# Patient Record
Sex: Male | Born: 1975 | Race: Black or African American | Hispanic: No | Marital: Married | Smoking: Never smoker
Health system: Southern US, Community
[De-identification: ages and names within clinical notes are randomized; demographics above are authoritative.]

## PROBLEM LIST (undated history)

## (undated) DIAGNOSIS — E119 Type 2 diabetes mellitus without complications: Secondary | ICD-10-CM

## (undated) DIAGNOSIS — K5900 Constipation, unspecified: Secondary | ICD-10-CM

## (undated) DIAGNOSIS — I639 Cerebral infarction, unspecified: Secondary | ICD-10-CM

## (undated) DIAGNOSIS — I10 Essential (primary) hypertension: Secondary | ICD-10-CM

## (undated) DIAGNOSIS — G629 Polyneuropathy, unspecified: Secondary | ICD-10-CM

## (undated) DIAGNOSIS — K219 Gastro-esophageal reflux disease without esophagitis: Secondary | ICD-10-CM

## (undated) HISTORY — DX: Type 2 diabetes mellitus without complications: E11.9

## (undated) HISTORY — DX: Constipation, unspecified: K59.00

## (undated) HISTORY — PX: APPENDECTOMY: SHX54

## (undated) HISTORY — DX: Polyneuropathy, unspecified: G62.9

## (undated) HISTORY — PX: OTHER SURGICAL HISTORY: SHX169

---

## 2004-09-21 ENCOUNTER — Emergency Department: Payer: Self-pay | Admitting: Emergency Medicine

## 2006-01-19 ENCOUNTER — Emergency Department (HOSPITAL_COMMUNITY): Admission: EM | Admit: 2006-01-19 | Discharge: 2006-01-19 | Payer: Self-pay | Admitting: Emergency Medicine

## 2006-01-20 ENCOUNTER — Emergency Department: Payer: Self-pay | Admitting: Emergency Medicine

## 2006-01-26 ENCOUNTER — Emergency Department: Payer: Self-pay | Admitting: Emergency Medicine

## 2007-05-13 HISTORY — PX: SPINAL FUSION: SHX223

## 2010-11-28 ENCOUNTER — Observation Stay (HOSPITAL_COMMUNITY)
Admission: EM | Admit: 2010-11-28 | Discharge: 2010-11-29 | Disposition: A | Discharge status: Home / Self Care | Payer: Non-veteran care | Source: Ambulatory Visit | Attending: Emergency Medicine | Admitting: Emergency Medicine

## 2010-11-28 ENCOUNTER — Emergency Department (HOSPITAL_COMMUNITY): Payer: Non-veteran care

## 2010-11-28 DIAGNOSIS — N289 Disorder of kidney and ureter, unspecified: Secondary | ICD-10-CM | POA: Insufficient documentation

## 2010-11-28 DIAGNOSIS — R079 Chest pain, unspecified: Principal | ICD-10-CM | POA: Insufficient documentation

## 2010-11-28 DIAGNOSIS — I10 Essential (primary) hypertension: Secondary | ICD-10-CM | POA: Insufficient documentation

## 2010-11-28 DIAGNOSIS — R51 Headache: Secondary | ICD-10-CM | POA: Insufficient documentation

## 2010-11-28 LAB — POCT I-STAT, CHEM 8
BUN: 10 mg/dL (ref 6–23)
Calcium, Ion: 1.17 mmol/L (ref 1.12–1.32)
Chloride: 103 mEq/L (ref 96–112)
Creatinine, Ser: 1.6 mg/dL — ABNORMAL HIGH (ref 0.50–1.35)
Glucose, Bld: 93 mg/dL (ref 70–99)
Potassium: 4.2 mEq/L (ref 3.5–5.1)

## 2010-11-29 DIAGNOSIS — R072 Precordial pain: Secondary | ICD-10-CM

## 2010-11-29 LAB — CK TOTAL AND CKMB (NOT AT ARMC)
CK, MB: 3.1 ng/mL (ref 0.3–4.0)
Relative Index: 0.4 (ref 0.0–2.5)

## 2010-11-29 LAB — TROPONIN I: Troponin I: <0.3 ng/mL (ref <0.30)

## 2010-11-29 LAB — CBC
Platelets: 259 10*3/uL (ref 150–400)
RBC: 5.13 MIL/uL (ref 4.22–5.81)
RDW: 13.4 % (ref 11.5–15.5)

## 2011-05-04 ENCOUNTER — Encounter: Payer: Self-pay | Admitting: Family Medicine

## 2011-05-04 ENCOUNTER — Emergency Department (HOSPITAL_BASED_OUTPATIENT_CLINIC_OR_DEPARTMENT_OTHER)
Admission: EM | Admit: 2011-05-04 | Discharge: 2011-05-04 | Disposition: A | Discharge status: Home / Self Care | Payer: Non-veteran care | Attending: Emergency Medicine | Admitting: Emergency Medicine

## 2011-05-04 ENCOUNTER — Emergency Department (INDEPENDENT_AMBULATORY_CARE_PROVIDER_SITE_OTHER): Payer: Non-veteran care

## 2011-05-04 ENCOUNTER — Other Ambulatory Visit: Payer: Self-pay

## 2011-05-04 DIAGNOSIS — I10 Essential (primary) hypertension: Secondary | ICD-10-CM | POA: Insufficient documentation

## 2011-05-04 DIAGNOSIS — M542 Cervicalgia: Secondary | ICD-10-CM | POA: Insufficient documentation

## 2011-05-04 DIAGNOSIS — W19XXXA Unspecified fall, initial encounter: Secondary | ICD-10-CM

## 2011-05-04 DIAGNOSIS — M25549 Pain in joints of unspecified hand: Secondary | ICD-10-CM

## 2011-05-04 DIAGNOSIS — R079 Chest pain, unspecified: Secondary | ICD-10-CM | POA: Insufficient documentation

## 2011-05-04 DIAGNOSIS — G8929 Other chronic pain: Secondary | ICD-10-CM | POA: Insufficient documentation

## 2011-05-04 DIAGNOSIS — M79609 Pain in unspecified limb: Secondary | ICD-10-CM | POA: Insufficient documentation

## 2011-05-04 DIAGNOSIS — R51 Headache: Secondary | ICD-10-CM | POA: Insufficient documentation

## 2011-05-04 DIAGNOSIS — R0602 Shortness of breath: Secondary | ICD-10-CM

## 2011-05-04 DIAGNOSIS — Z8679 Personal history of other diseases of the circulatory system: Secondary | ICD-10-CM | POA: Insufficient documentation

## 2011-05-04 DIAGNOSIS — M79646 Pain in unspecified finger(s): Secondary | ICD-10-CM

## 2011-05-04 HISTORY — DX: Essential (primary) hypertension: I10

## 2011-05-04 HISTORY — DX: Cerebral infarction, unspecified: I63.9

## 2011-05-04 LAB — BASIC METABOLIC PANEL
BUN: 10 mg/dL (ref 6–23)
CO2: 30 mEq/L (ref 19–32)
GFR calc non Af Amer: 69 mL/min — ABNORMAL LOW (ref >90)
Glucose, Bld: 101 mg/dL — ABNORMAL HIGH (ref 70–99)
Potassium: 3.3 mEq/L — ABNORMAL LOW (ref 3.5–5.1)
Sodium: 142 mEq/L (ref 135–145)

## 2011-05-04 LAB — CBC
Hemoglobin: 13.6 g/dL (ref 13.0–17.0)
MCH: 28 pg (ref 26.0–34.0)
MCV: 80.8 fL (ref 78.0–100.0)
RBC: 4.85 MIL/uL (ref 4.22–5.81)
WBC: 4.7 10*3/uL (ref 4.0–10.5)

## 2011-05-04 LAB — DIFFERENTIAL
Eosinophils Absolute: 0.3 10*3/uL (ref 0.0–0.7)
Eosinophils Relative: 7 % — ABNORMAL HIGH (ref 0–5)
Lymphocytes Relative: 32 % (ref 12–46)
Lymphs Abs: 1.5 10*3/uL (ref 0.7–4.0)
Monocytes Relative: 11 % (ref 3–12)
Neutrophils Relative %: 50 % (ref 43–77)

## 2011-05-04 MED ORDER — POTASSIUM CHLORIDE 20 MEQ/15ML (10%) PO LIQD
20.0000 meq | Freq: Once | ORAL | Status: AC
  Administered 2011-05-04: 20 meq via ORAL
  Filled 2011-05-04: qty 15

## 2011-05-04 MED ORDER — HYDROCODONE-ACETAMINOPHEN 5-500 MG PO TABS: 1.0000 | ORAL_TABLET | Freq: Four times a day (QID) | ORAL | Status: AC | PRN

## 2011-05-04 MED ORDER — DIPHENHYDRAMINE HCL 50 MG/ML IJ SOLN
25.0000 mg | Freq: Once | INTRAMUSCULAR | Status: AC
  Administered 2011-05-04: 25 mg via INTRAVENOUS
  Filled 2011-05-04: qty 1

## 2011-05-04 MED ORDER — MORPHINE SULFATE 4 MG/ML IJ SOLN
4.0000 mg | Freq: Once | INTRAMUSCULAR | Status: AC
  Administered 2011-05-04: 4 mg via INTRAVENOUS
  Filled 2011-05-04: qty 1

## 2011-05-04 MED ORDER — METOCLOPRAMIDE HCL 5 MG/ML IJ SOLN
10.0000 mg | Freq: Once | INTRAMUSCULAR | Status: AC
  Administered 2011-05-04: 10 mg via INTRAVENOUS
  Filled 2011-05-04: qty 2

## 2011-05-04 NOTE — ED Notes (Signed)
MD at bedside. 

## 2011-05-04 NOTE — ED Provider Notes (Signed)
History     CSN: [id number]  Arrival date & time 05/04/11  1035   First MD Initiated Contact with Patient 05/04/11 1146      Chief Complaint  Patient presents with  . Headache  . Pleurisy    (Consider location/radiation/quality/duration/timing/severity/associated sxs/prior treatment) HPI  pw multiple complaints. Pt state he woke up this morning with headaches, cp/sob. C/o 4/10 frontal headache without radiation. Pounding with photophobia, no phonophobia. Also c/o lt sharp cp without radiation. Sob with exertion. States he's had  Similar sx bfore c spine fusion.  Denies new numbness/tingling/weakness of extr.  Denies h/o VTE in self or family. No recent hosp/surg/immob. No h/o cancer. Denies exogenous hormone use, no leg pain or swelling  reports he was moving boxes all day yesterday and is "tired and sore". Requesting xr of right thumb as "it was bent back yestrday and it hurt"   Past Medical History  Diagnosis Date  . Hypertension   . Stroke     at age 69    Past Surgical History  Procedure Date  . Neck fusion     No family history on file.  History  Substance Use Topics  . Smoking status: Never Smoker   . Smokeless tobacco: Not on file  . Alcohol Use: No      Review of Systems  All other systems reviewed and are negative.   except as noted HPI  Allergies  Mobic  Home Medications   Current Outpatient Rx  Name Route Sig Dispense Refill  . HYDROCODONE-ACETAMINOPHEN 5-500 MG PO TABS Oral Take 1 tablet by mouth every 6 (six) hours as needed for pain. 15 tablet 0    BP 119/79  Pulse 57  Temp(Src) 98.2 F (36.8 C) (Oral)  Resp 16  Ht 6\' 1"  (1.854 m)  Wt 210 lb (95.255 kg)  BMI 27.71 kg/m2  SpO2 100%  Physical Exam  Nursing note and vitals reviewed. Constitutional: He is oriented to person, place, and time. He appears well-developed and well-nourished. No distress.  HENT:  Head: Atraumatic.  Mouth/Throat: Oropharynx is clear and moist.  Eyes:  Conjunctivae are normal. Pupils are equal, round, and reactive to light.  Neck: Neck supple.  Cardiovascular: Normal rate, regular rhythm, normal heart sounds and intact distal pulses.  Exam reveals no gallop and no friction rub.   No murmur heard. Pulmonary/Chest: Effort normal. No respiratory distress. He has no wheezes. He has no rales. He exhibits tenderness.       States lt cw ttp reproduces his pain  Abdominal: Soft. Bowel sounds are normal. There is no tenderness. There is no rebound and no guarding.  Musculoskeletal: Normal range of motion. He exhibits no edema and no tenderness.       No midline c/t/l/s ttp  Rt thumb with min ttp mCP. No ecchymosis, deformity, swelling. Cap refill < 2sec, gross sensation intact, full rom with min pain. No snuff box ttp.  Neurological: He is alert and oriented to person, place, and time.       Strength 5/5 all extr  Skin: Skin is warm and dry.  Psychiatric: He has a normal mood and affect.      Date: 05/04/2011  Rate: 68  Rhythm: normal sinus rhythm  QRS Axis: normal  Intervals: normal  ST/T Wave abnormalities: nonspecific ST changes unchanged t wave inversions V4/V5 new previously inverted t wave III now flattened  Conduction Disutrbances:none  Narrative Interpretation:   Old EKG Reviewed: changes noted   ED Course  Procedures (including critical care time)  Labs Reviewed  DIFFERENTIAL - Abnormal; Notable for the following:    Eosinophils Relative 7 (*)    All other components within normal limits  BASIC METABOLIC PANEL - Abnormal; Notable for the following:    Potassium 3.3 (*)    Glucose, Bld 101 (*)    GFR calc non Af Amer 69 (*)    GFR calc Af Amer 80 (*)    All other components within normal limits  CBC  D-DIMER, QUANTITATIVE   No results found.   1. Headache   2. Chest pain   3. Neck pain, chronic   4. Thumb pain     MDM  Multiple complaints- 1. Headache- migrainous components- do not suspect sah or meningitis.   Feeling better aftr reglan/benadryl, ivf  2. Cp/sob- likely msk cp. reproucible on exam. Constant since yesterday. ce neg, dimer neg.cp resolved here.3. Thumb injury- no fx. neurovasc intact. Possible FB on XR but no point of entry on Physical exam.  Will d/c ith pmd fu. Precautions for return.        Forbes Cellar, MD 05/06/11 [phone]

## 2011-05-04 NOTE — ED Notes (Signed)
Patient transported to X-ray 

## 2011-05-04 NOTE — ED Notes (Addendum)
Pt c/o headache 9/10 reports having a CT 2 wks ago for same type of headache. Pt also c/o "intermittent chest tightness" worse with movement and deep inspiration. 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 Pt sts he was lifting heavy objects yesterday and "think it's muscular". Pt sts "I also hurt my thumb yesterday and want to get it checked". Pt followed by [facility] and DR. Rosalie Doctor.

## 2011-06-17 ENCOUNTER — Emergency Department (HOSPITAL_BASED_OUTPATIENT_CLINIC_OR_DEPARTMENT_OTHER)
Admission: EM | Admit: 2011-06-17 | Discharge: 2011-06-17 | Disposition: A | Discharge status: Home / Self Care | Payer: Non-veteran care | Attending: Emergency Medicine | Admitting: Emergency Medicine

## 2011-06-17 ENCOUNTER — Encounter (HOSPITAL_BASED_OUTPATIENT_CLINIC_OR_DEPARTMENT_OTHER): Payer: Self-pay

## 2011-06-17 DIAGNOSIS — R112 Nausea with vomiting, unspecified: Secondary | ICD-10-CM | POA: Insufficient documentation

## 2011-06-17 DIAGNOSIS — K279 Peptic ulcer, site unspecified, unspecified as acute or chronic, without hemorrhage or perforation: Secondary | ICD-10-CM | POA: Insufficient documentation

## 2011-06-17 MED ORDER — OMEPRAZOLE 20 MG PO CPDR: 20.0000 mg | DELAYED_RELEASE_CAPSULE | Freq: Two times a day (BID) | ORAL | Status: DC

## 2011-06-17 MED ORDER — HYDROCODONE-ACETAMINOPHEN 5-325 MG PO TABS: 2.0000 | ORAL_TABLET | ORAL | Status: AC | PRN

## 2011-06-17 NOTE — ED Notes (Signed)
Pt reports headache, nausea and vomiting x 2 PTA.  He also reports "sour stomach". Dizziness and abdominal cramping intermittently since 05/30/11.

## 2011-12-24 ENCOUNTER — Encounter (HOSPITAL_BASED_OUTPATIENT_CLINIC_OR_DEPARTMENT_OTHER): Payer: Self-pay | Admitting: *Deleted

## 2011-12-24 ENCOUNTER — Emergency Department (HOSPITAL_BASED_OUTPATIENT_CLINIC_OR_DEPARTMENT_OTHER)
Admission: EM | Admit: 2011-12-24 | Discharge: 2011-12-25 | Disposition: A | Discharge status: Home / Self Care | Attending: Emergency Medicine | Admitting: Emergency Medicine

## 2011-12-24 ENCOUNTER — Emergency Department (HOSPITAL_BASED_OUTPATIENT_CLINIC_OR_DEPARTMENT_OTHER)

## 2011-12-24 DIAGNOSIS — R51 Headache: Secondary | ICD-10-CM | POA: Insufficient documentation

## 2011-12-24 DIAGNOSIS — R079 Chest pain, unspecified: Secondary | ICD-10-CM

## 2011-12-24 DIAGNOSIS — Z79899 Other long term (current) drug therapy: Secondary | ICD-10-CM | POA: Insufficient documentation

## 2011-12-24 DIAGNOSIS — Z8673 Personal history of transient ischemic attack (TIA), and cerebral infarction without residual deficits: Secondary | ICD-10-CM | POA: Insufficient documentation

## 2011-12-24 DIAGNOSIS — R42 Dizziness and giddiness: Secondary | ICD-10-CM | POA: Insufficient documentation

## 2011-12-24 DIAGNOSIS — I10 Essential (primary) hypertension: Secondary | ICD-10-CM | POA: Insufficient documentation

## 2011-12-24 LAB — COMPREHENSIVE METABOLIC PANEL
ALT: 25 U/L (ref 0–53)
AST: 30 U/L (ref 0–37)
Albumin: 4.3 g/dL (ref 3.5–5.2)
Calcium: 9.1 mg/dL (ref 8.4–10.5)
Creatinine, Ser: 1.4 mg/dL — ABNORMAL HIGH (ref 0.50–1.35)
GFR calc non Af Amer: 63 mL/min — ABNORMAL LOW (ref >90)
Sodium: 142 mEq/L (ref 135–145)
Total Protein: 7.4 g/dL (ref 6.0–8.3)

## 2011-12-24 LAB — URINALYSIS, ROUTINE W REFLEX MICROSCOPIC
Bilirubin Urine: NEGATIVE
Hgb urine dipstick: NEGATIVE
Nitrite: NEGATIVE
Protein, ur: 100 mg/dL — AB
Specific Gravity, Urine: 1.017 (ref 1.005–1.030)
Urobilinogen, UA: 1 mg/dL (ref 0.0–1.0)

## 2011-12-24 LAB — TROPONIN I: Troponin I: <0.3 ng/mL (ref <0.30)

## 2011-12-24 LAB — CBC WITH DIFFERENTIAL/PLATELET
Basophils Absolute: 0 10*3/uL (ref 0.0–0.1)
Basophils Relative: 0 % (ref 0–1)
Eosinophils Absolute: 0.3 10*3/uL (ref 0.0–0.7)
Eosinophils Relative: 6 % — ABNORMAL HIGH (ref 0–5)
HCT: 35.5 % — ABNORMAL LOW (ref 39.0–52.0)
Lymphocytes Relative: 37 % (ref 12–46)
MCHC: 35.5 g/dL (ref 30.0–36.0)
MCV: 81.2 fL (ref 78.0–100.0)
Monocytes Absolute: 0.4 10*3/uL (ref 0.1–1.0)
Platelets: 234 10*3/uL (ref 150–400)
RDW: 13.9 % (ref 11.5–15.5)
WBC: 6 10*3/uL (ref 4.0–10.5)

## 2011-12-24 LAB — URINE MICROSCOPIC-ADD ON

## 2011-12-24 MED ORDER — HYDROMORPHONE HCL 2 MG PO TABS: 2.0000 mg | ORAL_TABLET | ORAL | Status: AC | PRN

## 2011-12-24 NOTE — ED Notes (Signed)
MD at bedside. 

## 2011-12-24 NOTE — ED Provider Notes (Signed)
History     CSN: [id number]  Arrival date & time 12/24/11  2016   First MD Initiated Contact with Patient 12/24/11 2149      Chief Complaint  Patient presents with  . Headache  . Fatigue    (Consider location/radiation/quality/duration/timing/severity/associated sxs/prior treatment) Patient is a 36 y.o. male presenting with headaches. The history is provided by the patient.  Headache  This is a new problem. The current episode started more than 2 days ago (Ongoing for 8 days.). The problem occurs constantly. The problem has not changed since onset.The headache is associated with nothing. Pertinent negatives include no fever, no shortness of breath, no nausea and no vomiting.   headache is bilateral frontal associated with decreased appetite bodyaches intermittent left-sided chest pain symptoms been ongoing for 8 days no nausea vomiting or diarrhea no rash no abdominal pain no back pain no neck pain. Associated with some fatigue.  Past Medical History  Diagnosis Date  . Hypertension   . Stroke     at age 21    Past Surgical History  Procedure Date  . Neck fusion     History reviewed. No pertinent family history.  History  Substance Use Topics  . Smoking status: Never Smoker   . Smokeless tobacco: Not on file  . Alcohol Use: No      Review of Systems  Constitutional: Positive for fatigue. Negative for fever and chills.  HENT: Negative for congestion, sore throat and neck pain.   Respiratory: Negative for cough and shortness of breath.   Cardiovascular: Positive for chest pain. Negative for leg swelling.  Gastrointestinal: Negative for nausea, vomiting, abdominal pain and diarrhea.  Genitourinary: Negative for dysuria and hematuria.  Musculoskeletal: Positive for myalgias. Negative for back pain.  Skin: Negative for rash.  Neurological: Positive for dizziness and headaches. Negative for weakness and numbness.  Hematological: Does not bruise/bleed easily.     Allergies  Meloxicam  Home Medications   Current Outpatient Rx  Name Route Sig Dispense Refill  . CYCLOBENZAPRINE HCL 5 MG PO TABS Oral Take 5 mg by mouth 3 (three) times daily as needed. For spasms.    . IBUPROFEN 800 MG PO TABS Oral Take 800 mg by mouth every 8 (eight) hours as needed. For headache.    . OMEPRAZOLE 20 MG PO CPDR Oral Take 1 capsule (20 mg total) by mouth 2 (two) times daily. 30 capsule 2  . HYDROMORPHONE HCL 2 MG PO TABS Oral Take 1 tablet (2 mg total) by mouth every 4 (four) hours as needed for pain. 20 tablet 0    BP 157/102  Pulse 64  Temp 98.6 F (37 C) (Oral)  Resp 16  Ht 6' (1.829 m)  Wt 195 lb (88.451 kg)  BMI 26.45 kg/m2  SpO2 100%  Physical Exam  Nursing note and vitals reviewed. Constitutional: He is oriented to person, place, and time. He appears well-developed and well-nourished. No distress.  HENT:  Head: Normocephalic and atraumatic.  Mouth/Throat: Oropharynx is clear and moist.  Eyes: Conjunctivae and EOM are normal. Pupils are equal, round, and reactive to light.  Neck: Normal range of motion. Neck supple.  Cardiovascular: Normal rate, regular rhythm, normal heart sounds and intact distal pulses.   No murmur heard. Pulmonary/Chest: Effort normal and breath sounds normal. No respiratory distress. He has no wheezes. He has no rales.  Abdominal: Soft. Bowel sounds are normal. There is no tenderness.  Musculoskeletal: Normal range of motion. He exhibits no edema and no  tenderness.  Lymphadenopathy:    He has no cervical adenopathy.  Neurological: He is alert and oriented to person, place, and time. No cranial nerve deficit. He exhibits normal muscle tone. Coordination normal.  Skin: Skin is warm. No rash noted. No erythema.    ED Course  Procedures (including critical care time)  Labs Reviewed  URINALYSIS, ROUTINE W REFLEX MICROSCOPIC - Abnormal; Notable for the following:    Protein, ur 100 (*)     All other components within  normal limits  CBC WITH DIFFERENTIAL - Abnormal; Notable for the following:    Hemoglobin 12.6 (*)     HCT 35.5 (*)     Eosinophils Relative 6 (*)     All other components within normal limits  COMPREHENSIVE METABOLIC PANEL - Abnormal; Notable for the following:    Potassium 3.1 (*)     Glucose, Bld 109 (*)     Creatinine, Ser 1.40 (*)     GFR calc non Af Amer 63 (*)     GFR calc Af Amer 73 (*)     All other components within normal limits  TROPONIN I  LIPASE, BLOOD  URINE MICROSCOPIC-ADD ON   Dg Chest 2 View  12/24/2011  *RADIOLOGY REPORT*  Clinical Data: Left chest pain, headache.  CHEST - 2 VIEW  Comparison: 05/04/2011  Findings: Cervical fixation hardware is partially seen. Lungs clear.  Heart size and pulmonary vascularity normal.  No effusion. Visualized bones unremarkable.  IMPRESSION: No acute disease  Original Report Authenticated By: Osa Craver, M.D.   Ct Head Wo Contrast  12/24/2011  *RADIOLOGY REPORT*  Clinical Data: Headache, weakness.  CT HEAD WITHOUT CONTRAST  Technique:  Contiguous axial images were obtained from the base of the skull through the vertex without contrast.  Comparison: None.  Findings: Mild atrophy. There is no evidence of acute intracranial hemorrhage, brain edema, mass lesion, acute infarction,   mass effect, or midline shift. Acute infarct may be inapparent on noncontrast CT.  No other intra-axial abnormalities are seen, and the ventricles and sulci are within normal limits in size and symmetry.   No abnormal extra-axial fluid collections or masses are identified.  No significant calvarial abnormality.  IMPRESSION: 1. Negative for bleed or other acute intracranial process.  Original Report Authenticated By: Osa Craver, M.D.   Results for orders placed during the hospital encounter of 12/24/11  URINALYSIS, ROUTINE W REFLEX MICROSCOPIC      Component Value Range   Color, Urine YELLOW  YELLOW   APPearance CLEAR  CLEAR   Specific  Gravity, Urine 1.017  1.005 - 1.030   pH 6.0  5.0 - 8.0   Glucose, UA NEGATIVE  NEGATIVE mg/dL   Hgb urine dipstick NEGATIVE  NEGATIVE   Bilirubin Urine NEGATIVE  NEGATIVE   Ketones, ur NEGATIVE  NEGATIVE mg/dL   Protein, ur 865 (*) NEGATIVE mg/dL   Urobilinogen, UA 1.0  0.0 - 1.0 mg/dL   Nitrite NEGATIVE  NEGATIVE   Leukocytes, UA NEGATIVE  NEGATIVE  TROPONIN I      Component Value Range   Troponin I <0.30  <0.30 ng/mL  CBC WITH DIFFERENTIAL      Component Value Range   WBC 6.0  4.0 - 10.5 K/uL   RBC 4.37  4.22 - 5.81 MIL/uL   Hemoglobin 12.6 (*) 13.0 - 17.0 g/dL   HCT 78.4 (*) 69.6 - 29.5 %   MCV 81.2  78.0 - 100.0 fL   MCH 28.8  26.0 - 34.0 pg   MCHC 35.5  30.0 - 36.0 g/dL   RDW 78.2  95.6 - 21.3 %   Platelets 234  150 - 400 K/uL   Neutrophils Relative 51  43 - 77 %   Neutro Abs 3.0  1.7 - 7.7 K/uL   Lymphocytes Relative 37  12 - 46 %   Lymphs Abs 2.2  0.7 - 4.0 K/uL   Monocytes Relative 7  3 - 12 %   Monocytes Absolute 0.4  0.1 - 1.0 K/uL   Eosinophils Relative 6 (*) 0 - 5 %   Eosinophils Absolute 0.3  0.0 - 0.7 K/uL   Basophils Relative 0  0 - 1 %   Basophils Absolute 0.0  0.0 - 0.1 K/uL  COMPREHENSIVE METABOLIC PANEL      Component Value Range   Sodium 142  135 - 145 mEq/L   Potassium 3.1 (*) 3.5 - 5.1 mEq/L   Chloride 104  96 - 112 mEq/L   CO2 29  19 - 32 mEq/L   Glucose, Bld 109 (*) 70 - 99 mg/dL   BUN 9  6 - 23 mg/dL   Creatinine, Ser 0.86 (*) 0.50 - 1.35 mg/dL   Calcium 9.1  8.4 - 57.8 mg/dL   Total Protein 7.4  6.0 - 8.3 g/dL   Albumin 4.3  3.5 - 5.2 g/dL   AST 30  0 - 37 U/L   ALT 25  0 - 53 U/L   Alkaline Phosphatase 89  39 - 117 U/L   Total Bilirubin 0.8  0.3 - 1.2 mg/dL   GFR calc non Af Amer 63 (*) >90 mL/min   GFR calc Af Amer 73 (*) >90 mL/min  LIPASE, BLOOD      Component Value Range   Lipase 34  11 - 59 U/L  URINE MICROSCOPIC-ADD ON      Component Value Range   Squamous Epithelial / LPF RARE  RARE   Bacteria, UA RARE  RARE    Date:  12/24/2011  Rate: 60  Rhythm: normal sinus rhythm  QRS Axis: normal  Intervals: normal  ST/T Wave abnormalities: nonspecific T wave changes  Conduction Disutrbances:none  Narrative Interpretation:   Old EKG Reviewed: unchanged  EKG is unchanged from 05/04/2011.   1. Headache   2. Chest pain       MDM  Patient symptoms have been ongoing for 8 days include headaches and dizziness fatigue some bodyaches a little bit intermittent chest pain today workup here today is negative for the most part his CT negative cardiac markers negative EKG without any acute changes may just be a viral process. Patient given reassurance told to followup with the aorta at a local doctor to resource guide his symptoms persist he can return for new worse symptoms. Work note provided to give it some rest for couple days patient recommended to take Naprosyn or Motrin he is able to take this despite his allergy also given some Dilaudid as needed for more severe pain. Certainly in the emergency department no acute distress nontoxic.       Shelda Jakes, MD 12/25/11 0000

## 2011-12-24 NOTE — ED Notes (Signed)
Pt c/o headache, dizziness, and fatigue since last week. Denies any cold or cough, no bites or fevers.

## 2013-04-13 ENCOUNTER — Encounter (HOSPITAL_COMMUNITY): Payer: Self-pay | Admitting: Emergency Medicine

## 2013-04-13 ENCOUNTER — Emergency Department (HOSPITAL_COMMUNITY)
Admission: EM | Admit: 2013-04-13 | Discharge: 2013-04-13 | Discharge status: Against Medical Advice | Attending: Emergency Medicine | Admitting: Emergency Medicine

## 2013-04-13 DIAGNOSIS — R0602 Shortness of breath: Secondary | ICD-10-CM | POA: Insufficient documentation

## 2013-04-13 DIAGNOSIS — R630 Anorexia: Secondary | ICD-10-CM | POA: Insufficient documentation

## 2013-04-13 DIAGNOSIS — Z8673 Personal history of transient ischemic attack (TIA), and cerebral infarction without residual deficits: Secondary | ICD-10-CM | POA: Insufficient documentation

## 2013-04-13 DIAGNOSIS — I10 Essential (primary) hypertension: Secondary | ICD-10-CM | POA: Insufficient documentation

## 2013-04-13 DIAGNOSIS — R079 Chest pain, unspecified: Secondary | ICD-10-CM | POA: Insufficient documentation

## 2013-04-13 LAB — CBC
HCT: 37.9 % — ABNORMAL LOW (ref 39.0–52.0)
Hemoglobin: 13.3 g/dL (ref 13.0–17.0)
MCHC: 35.1 g/dL (ref 30.0–36.0)
RBC: 4.68 MIL/uL (ref 4.22–5.81)
WBC: 5.8 10*3/uL (ref 4.0–10.5)

## 2013-04-13 LAB — BASIC METABOLIC PANEL
Chloride: 102 mEq/L (ref 96–112)
GFR calc non Af Amer: 66 mL/min — ABNORMAL LOW (ref >90)
Glucose, Bld: 99 mg/dL (ref 70–99)
Potassium: 3.9 mEq/L (ref 3.5–5.1)
Sodium: 140 mEq/L (ref 135–145)

## 2013-04-13 LAB — POCT I-STAT TROPONIN I

## 2013-04-13 NOTE — ED Notes (Signed)
Pt in c/o chest pain, states pain started last night and was sharp then eased to aching pain and has been intermittent all day, decreased appetite and mild shortness of breath today also

## 2013-04-13 NOTE — ED Notes (Signed)
No answer in waiting room 

## 2013-04-13 NOTE — ED Notes (Signed)
No answer in waiting area.

## 2013-05-10 ENCOUNTER — Encounter (HOSPITAL_COMMUNITY): Payer: Self-pay | Admitting: Emergency Medicine

## 2013-05-10 DIAGNOSIS — Z8673 Personal history of transient ischemic attack (TIA), and cerebral infarction without residual deficits: Secondary | ICD-10-CM | POA: Insufficient documentation

## 2013-05-10 DIAGNOSIS — I10 Essential (primary) hypertension: Secondary | ICD-10-CM | POA: Insufficient documentation

## 2013-05-10 DIAGNOSIS — R1110 Vomiting, unspecified: Secondary | ICD-10-CM | POA: Insufficient documentation

## 2013-05-10 DIAGNOSIS — Z79899 Other long term (current) drug therapy: Secondary | ICD-10-CM | POA: Insufficient documentation

## 2013-05-10 DIAGNOSIS — J209 Acute bronchitis, unspecified: Secondary | ICD-10-CM | POA: Insufficient documentation

## 2013-05-10 NOTE — ED Notes (Signed)
Pt. reports nausea/vomitting onset today , denies diarrhea, no fever or chills.

## 2013-05-11 ENCOUNTER — Emergency Department (HOSPITAL_COMMUNITY)
Admission: EM | Admit: 2013-05-11 | Discharge: 2013-05-11 | Disposition: A | Discharge status: Home / Self Care | Attending: Emergency Medicine | Admitting: Emergency Medicine

## 2013-05-11 ENCOUNTER — Encounter (HOSPITAL_COMMUNITY): Payer: Self-pay | Admitting: Emergency Medicine

## 2013-05-11 ENCOUNTER — Emergency Department (HOSPITAL_COMMUNITY)

## 2013-05-11 DIAGNOSIS — J209 Acute bronchitis, unspecified: Secondary | ICD-10-CM

## 2013-05-11 DIAGNOSIS — R1110 Vomiting, unspecified: Secondary | ICD-10-CM

## 2013-05-11 LAB — BASIC METABOLIC PANEL
CO2: 27 mEq/L (ref 19–32)
Chloride: 100 mEq/L (ref 96–112)
Glucose, Bld: 102 mg/dL — ABNORMAL HIGH (ref 70–99)
Sodium: 140 mEq/L (ref 137–147)

## 2013-05-11 LAB — CBC WITH DIFFERENTIAL/PLATELET
Basophils Absolute: 0 10*3/uL (ref 0.0–0.1)
HCT: 36.4 % — ABNORMAL LOW (ref 39.0–52.0)
Lymphocytes Relative: 17 % (ref 12–46)
Lymphs Abs: 1.3 10*3/uL (ref 0.7–4.0)
Monocytes Absolute: 0.8 10*3/uL (ref 0.1–1.0)
Neutro Abs: 5.1 10*3/uL (ref 1.7–7.7)
Platelets: 214 10*3/uL (ref 150–400)
RBC: 4.47 MIL/uL (ref 4.22–5.81)
RDW: 13.3 % (ref 11.5–15.5)
WBC: 7.6 10*3/uL (ref 4.0–10.5)

## 2013-05-11 MED ORDER — IPRATROPIUM BROMIDE 0.02 % IN SOLN
0.5000 mg | Freq: Once | RESPIRATORY_TRACT | Status: AC
  Administered 2013-05-11: 0.5 mg via RESPIRATORY_TRACT
  Filled 2013-05-11: qty 2.5

## 2013-05-11 MED ORDER — ALBUTEROL SULFATE HFA 108 (90 BASE) MCG/ACT IN AERS: 2.0000 | INHALATION_SPRAY | RESPIRATORY_TRACT | Status: AC | PRN

## 2013-05-11 MED ORDER — ACETAMINOPHEN 325 MG PO TABS
650.0000 mg | ORAL_TABLET | Freq: Once | ORAL | Status: AC
  Administered 2013-05-11: 650 mg via ORAL
  Filled 2013-05-11: qty 2

## 2013-05-11 MED ORDER — ALBUTEROL SULFATE (2.5 MG/3ML) 0.083% IN NEBU
2.5000 mg | INHALATION_SOLUTION | Freq: Once | RESPIRATORY_TRACT | Status: AC
  Administered 2013-05-11: 2.5 mg via RESPIRATORY_TRACT
  Filled 2013-05-11: qty 3

## 2013-05-11 NOTE — ED Provider Notes (Signed)
CSN: [id number]     Arrival date & time 05/10/13  2335 History   First MD Initiated Contact with Patient 05/11/13 0242     Chief Complaint  Patient presents with  . Emesis   (Consider location/radiation/quality/duration/timing/severity/associated sxs/prior Treatment) Patient is a 37 y.o. male presenting with vomiting. The history is provided by the patient.  Emesis He started having nasal congestion and runny nose 3 days ago. Today, he started having a cough productive of some yellowish sputum. There is post tussive emesis. He is also having some yellowish to greenish rhinorrhea. He denies fever, chills, sweats. Denies arthralgias or myalgias. He denies diarrhea and he denies nausea. He states the only vomiting that has occurred has been posttussive. He did have a sick contact 2 days before he started getting sick. He is a nonsmoker. He did not get the influenza immunization this year.  Past Medical History  Diagnosis Date  . Hypertension   . Stroke     at age 78   Past Surgical History  Procedure Laterality Date  . Neck fusion     History reviewed. No pertinent family history. History  Substance Use Topics  . Smoking status: Never Smoker   . Smokeless tobacco: Not on file  . Alcohol Use: No    Review of Systems  Gastrointestinal: Positive for vomiting.  All other systems reviewed and are negative.    Allergies  Mobic  Home Medications   Current Outpatient Rx  Name  Route  Sig  Dispense  Refill  . cyclobenzaprine (FLEXERIL) 5 MG tablet   Oral   Take 5 mg by mouth 3 (three) times daily as needed. For spasms.         Marland Kitchen ibuprofen (ADVIL,MOTRIN) 800 MG tablet   Oral   Take 800 mg by mouth every 8 (eight) hours as needed. For headache.         . lisinopril (PRINIVIL,ZESTRIL) 5 MG tablet   Oral   Take 2.5 mg by mouth daily.         . traMADol (ULTRAM) 50 MG tablet   Oral   Take 50 mg by mouth every 8 (eight) hours as needed (neck pain).          BP  132/88  Pulse 62  Temp(Src) 98.2 F (36.8 C) (Oral)  Resp 18  SpO2 93% Physical Exam  Nursing note and vitals reviewed.  37 year old male, resting comfortably and in no acute distress. Vital signs are significant for mild hypertension with blood pressure 150/95. Oxygen saturation is 100%, which is normal. Head is normocephalic and atraumatic. PERRLA, EOMI. Oropharynx is clear. There is no sinus tenderness. Neck is nontender and supple without adenopathy or JVD. Back is nontender and there is no CVA tenderness. Lungs have a slightly prolonged exhalation phase without rales, wheezes, or rhonchi. Mild wheezing is noted with coughing. Chest is nontender. Heart has regular rate and rhythm without murmur. Abdomen is soft, flat, nontender without masses or hepatosplenomegaly and peristalsis is normoactive. Extremities have no cyanosis or edema, full range of motion is present. Skin is warm and dry without rash. Neurologic: Mental status is normal, cranial nerves are intact, there are no motor or sensory deficits.  ED Course  Procedures (including critical care time) Labs Review Results for orders placed during the hospital encounter of 05/11/13  CBC WITH DIFFERENTIAL      Result Value Range   WBC 7.6  4.0 - 10.5 K/uL   RBC 4.47  4.22 -  5.81 MIL/uL   Hemoglobin 12.9 (*) 13.0 - 17.0 g/dL   HCT 81.1 (*) 91.4 - 78.2 %   MCV 81.4  78.0 - 100.0 fL   MCH 28.9  26.0 - 34.0 pg   MCHC 35.4  30.0 - 36.0 g/dL   RDW 95.6  21.3 - 08.6 %   Platelets 214  150 - 400 K/uL   Neutrophils Relative % 68  43 - 77 %   Neutro Abs 5.1  1.7 - 7.7 K/uL   Lymphocytes Relative 17  12 - 46 %   Lymphs Abs 1.3  0.7 - 4.0 K/uL   Monocytes Relative 11  3 - 12 %   Monocytes Absolute 0.8  0.1 - 1.0 K/uL   Eosinophils Relative 4  0 - 5 %   Eosinophils Absolute 0.3  0.0 - 0.7 K/uL   Basophils Relative 1  0 - 1 %   Basophils Absolute 0.0  0.0 - 0.1 K/uL  BASIC METABOLIC PANEL      Result Value Range   Sodium 140   137 - 147 mEq/L   Potassium 3.5 (*) 3.7 - 5.3 mEq/L   Chloride 100  96 - 112 mEq/L   CO2 27  19 - 32 mEq/L   Glucose, Bld 102 (*) 70 - 99 mg/dL   BUN 11  6 - 23 mg/dL   Creatinine, Ser 5.78  0.50 - 1.35 mg/dL   Calcium 8.2 (*) 8.4 - 10.5 mg/dL   GFR calc non Af Amer 76 (*) >90 mL/min   GFR calc Af Amer 88 (*) >90 mL/min    Imaging Review Dg Chest 2 View  05/11/2013   CLINICAL DATA:  Cough with vomiting  EXAM: CHEST  2 VIEW  COMPARISON:  Prior radiograph from 12/24/2011  FINDINGS: The cardiac and mediastinal silhouettes are stable in size and contour, and remain within normal limits.  The lungs are normally inflated. No airspace consolidation, pleural effusion, or pulmonary edema is identified. There is no pneumothorax.  No acute osseous abnormality identified. ACDF overlies the lower cervical spine.  IMPRESSION: No active cardiopulmonary disease.   Electronically Signed   By: Rise Mu M.D.   On: 05/11/2013 03:26    MDM   1. Acute bronchitis   2. Post-tussive emesis    Respiratory tract infection with rhinorrhea and cough and posttussive emesis. Chest x-ray or be obtained to rule out pneumonia, and he will be given a therapeutic trial of albuterol with ipratropium.  He feels much better after above noted treatment. Chest x-ray shows no evidence of pneumonia. He is discharged with prescription for albuterol inhaler.  Dione Booze, MD 05/11/13 [phone]

## 2013-09-05 ENCOUNTER — Encounter (HOSPITAL_COMMUNITY): Payer: Self-pay | Admitting: Emergency Medicine

## 2013-09-05 ENCOUNTER — Emergency Department (HOSPITAL_COMMUNITY)
Admission: EM | Admit: 2013-09-05 | Discharge: 2013-09-05 | Discharge status: Against Medical Advice | Attending: Emergency Medicine | Admitting: Emergency Medicine

## 2013-09-05 DIAGNOSIS — Z8673 Personal history of transient ischemic attack (TIA), and cerebral infarction without residual deficits: Secondary | ICD-10-CM | POA: Insufficient documentation

## 2013-09-05 DIAGNOSIS — I10 Essential (primary) hypertension: Secondary | ICD-10-CM | POA: Insufficient documentation

## 2013-09-05 DIAGNOSIS — R109 Unspecified abdominal pain: Secondary | ICD-10-CM | POA: Insufficient documentation

## 2013-09-05 LAB — CBC WITH DIFFERENTIAL/PLATELET
BASOS ABS: 0 10*3/uL (ref 0.0–0.1)
BASOS PCT: 0 % (ref 0–1)
EOS ABS: 0.3 10*3/uL (ref 0.0–0.7)
Eosinophils Relative: 5 % (ref 0–5)
HCT: 40.2 % (ref 39.0–52.0)
HEMOGLOBIN: 14 g/dL (ref 13.0–17.0)
Lymphocytes Relative: 33 % (ref 12–46)
Lymphs Abs: 2 10*3/uL (ref 0.7–4.0)
MCH: 28.1 pg (ref 26.0–34.0)
MCHC: 34.8 g/dL (ref 30.0–36.0)
MCV: 80.7 fL (ref 78.0–100.0)
Monocytes Absolute: 0.4 10*3/uL (ref 0.1–1.0)
Monocytes Relative: 7 % (ref 3–12)
NEUTROS ABS: 3.3 10*3/uL (ref 1.7–7.7)
NEUTROS PCT: 55 % (ref 43–77)
PLATELETS: 232 10*3/uL (ref 150–400)
RBC: 4.98 MIL/uL (ref 4.22–5.81)
RDW: 13.5 % (ref 11.5–15.5)
WBC: 5.9 10*3/uL (ref 4.0–10.5)

## 2013-09-05 LAB — COMPREHENSIVE METABOLIC PANEL
ALBUMIN: 4.3 g/dL (ref 3.5–5.2)
ALK PHOS: 97 U/L (ref 39–117)
ALT: 31 U/L (ref 0–53)
AST: 32 U/L (ref 0–37)
BILIRUBIN TOTAL: 0.7 mg/dL (ref 0.3–1.2)
BUN: 10 mg/dL (ref 6–23)
CHLORIDE: 105 meq/L (ref 96–112)
CO2: 27 mEq/L (ref 19–32)
Calcium: 9.3 mg/dL (ref 8.4–10.5)
Creatinine, Ser: 1.37 mg/dL — ABNORMAL HIGH (ref 0.50–1.35)
GFR calc Af Amer: 74 mL/min — ABNORMAL LOW (ref >90)
GFR calc non Af Amer: 64 mL/min — ABNORMAL LOW (ref >90)
Glucose, Bld: 90 mg/dL (ref 70–99)
POTASSIUM: 3.6 meq/L — AB (ref 3.7–5.3)
Sodium: 146 mEq/L (ref 137–147)
TOTAL PROTEIN: 7.9 g/dL (ref 6.0–8.3)

## 2013-09-05 NOTE — ED Notes (Signed)
Pt reports LLQ and left groin pain since today. Reports this happened before but it went away. Reports left testicle is tender but is not swollen. States pain feels likes an ache. Denies n/v but reports he is feeling weak. Pt is a x 4. VSS

## 2013-09-05 NOTE — ED Notes (Signed)
Attempted to call x 3 with no response.

## 2020-05-29 ENCOUNTER — Other Ambulatory Visit: Payer: Self-pay

## 2020-05-29 ENCOUNTER — Emergency Department (HOSPITAL_COMMUNITY)
Admission: EM | Admit: 2020-05-29 | Discharge: 2020-05-29 | Disposition: A | Discharge status: Home / Self Care | Payer: No Typology Code available for payment source | Attending: Emergency Medicine | Admitting: Emergency Medicine

## 2020-05-29 ENCOUNTER — Emergency Department (HOSPITAL_COMMUNITY): Payer: No Typology Code available for payment source

## 2020-05-29 ENCOUNTER — Encounter (HOSPITAL_COMMUNITY): Payer: Self-pay | Admitting: Emergency Medicine

## 2020-05-29 DIAGNOSIS — Z79899 Other long term (current) drug therapy: Secondary | ICD-10-CM | POA: Diagnosis not present

## 2020-05-29 DIAGNOSIS — W009XXA Unspecified fall due to ice and snow, initial encounter: Secondary | ICD-10-CM | POA: Insufficient documentation

## 2020-05-29 DIAGNOSIS — S99912A Unspecified injury of left ankle, initial encounter: Secondary | ICD-10-CM | POA: Diagnosis present

## 2020-05-29 DIAGNOSIS — S82842A Displaced bimalleolar fracture of left lower leg, initial encounter for closed fracture: Secondary | ICD-10-CM | POA: Diagnosis not present

## 2020-05-29 DIAGNOSIS — S9305XA Dislocation of left ankle joint, initial encounter: Secondary | ICD-10-CM | POA: Insufficient documentation

## 2020-05-29 DIAGNOSIS — I10 Essential (primary) hypertension: Secondary | ICD-10-CM | POA: Diagnosis not present

## 2020-05-29 DIAGNOSIS — W19XXXA Unspecified fall, initial encounter: Secondary | ICD-10-CM

## 2020-05-29 DIAGNOSIS — S82892A Other fracture of left lower leg, initial encounter for closed fracture: Secondary | ICD-10-CM

## 2020-05-29 MED ORDER — ONDANSETRON HCL 4 MG/2ML IJ SOLN
4.0000 mg | Freq: Once | INTRAMUSCULAR | Status: AC
  Administered 2020-05-29: 4 mg via INTRAVENOUS
  Filled 2020-05-29: qty 2

## 2020-05-29 MED ORDER — HYDROMORPHONE HCL 1 MG/ML IJ SOLN
1.0000 mg | Freq: Once | INTRAMUSCULAR | Status: AC
  Administered 2020-05-29: 1 mg via INTRAVENOUS
  Filled 2020-05-29: qty 1

## 2020-05-29 MED ORDER — OXYCODONE-ACETAMINOPHEN 5-325 MG PO TABS: 1.0000 | ORAL_TABLET | ORAL | 0 refills | Status: DC | PRN

## 2020-05-29 MED ORDER — PROPOFOL 10 MG/ML IV BOLUS
0.5000 mg/kg | Freq: Once | INTRAVENOUS | Status: DC
  Filled 2020-05-29: qty 20

## 2020-05-29 MED ORDER — PROPOFOL 10 MG/ML IV BOLUS
INTRAVENOUS | Status: AC | PRN
  Administered 2020-05-29: 44.25 mg via INTRAVENOUS

## 2020-05-29 NOTE — Consult Note (Signed)
Reason for Consult: Left ankle pain Referring Physician: Dr. Meridee Score  Garrett Thompson. is an 45 y.o. male.  HPI: Patient slipped on ice today.  Sustained left ankle injury.  Denies any other orthopedic complaints.  No personal or family history of DVT or pulmonary embolism.  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  He is active.  Past Medical History:  Diagnosis Date  . Hypertension   . Stroke [facility])    at age 12    Past Surgical History:  Procedure Laterality Date  . neck fusion      History reviewed. No pertinent family history.  Social History:  reports that he has never smoked. He has never used smokeless tobacco. He reports that he does not drink alcohol and does not use drugs.  Allergies:  Allergies  Allergen Reactions  . Mobic [Meloxicam]     Nose Bleed    Medications: I have reviewed the patient's current medications.  No results found for this or any previous visit (from the past 48 hour(s)).  DG Ankle Complete Left  Result Date: 05/29/2020 CLINICAL DATA:  Fall EXAM: LEFT ANKLE COMPLETE - 3+ VIEW COMPARISON:  None. FINDINGS: Acute fracture involving distal shaft of the fibula with greater than 1 shaft diameter lateral and posterior displacement of distal fracture fragment in addition to moderate angulation. Acute mildly comminuted fracture involving the medial malleolus of the distal tibia. Lateral and posterior dislocation of the talus with respect to the distal tibia. IMPRESSION: Acute displaced and angulated fracture involving distal fibula with acute mildly comminuted medial malleolar fracture. There is lateral and posterior dislocation of the talus with respect to the distal tibia. Electronically Signed   By: Jasmine Pang M.D.   On: 05/29/2020 20:31    Review of Systems  Musculoskeletal: Positive for arthralgias.  All other systems reviewed and are negative.  Blood pressure (!) 192/123, pulse 97, temperature 98.7 F (37.1 C),  temperature source Oral, resp. rate 16, height 6' (1.829 m), weight 88.5 kg, SpO2 99 %. Physical Exam Vitals reviewed.  HENT:     Head: Normocephalic.     Nose: Nose normal.     Mouth/Throat:     Mouth: Mucous membranes are moist.  Eyes:     Pupils: Pupils are equal, round, and reactive to light.  Cardiovascular:     Rate and Rhythm: Normal rate.     Pulses: Normal pulses.  Pulmonary:     Effort: Pulmonary effort is normal.  Abdominal:     General: Abdomen is flat.  Musculoskeletal:     Cervical back: Normal range of motion.  Skin:    General: Skin is warm.     Capillary Refill: Capillary refill takes less than 2 seconds.  Neurological:     General: No focal deficit present.     Mental Status: He is alert.  Psychiatric:        Mood and Affect: Mood normal.    Weight examination of the left ankle demonstrates palpable pedal pulses soft compartments intact ankle and toe dorsiflexion plantarflexion but it is painful.  Both knees have no effusion.  No upper extremity complaints with reasonable range of motion wrist elbows shoulders. Assessment/Plan: Impression is left ankle fracture dislocation.  Patient has lateral subluxation of the talus relative to the tibia.  Under conscious sedation the ankle is reduced.  Post reduction radiographs look good and show improved alignment.  Plan is for delayed open reduction internal fixation within 7 days.  Patient will need to keep the leg elevated.  All questions answered.  Plan for nonweightbearing with return to clinic on Friday to schedule surgical intervention.  We will need to examine the ankle at that time for fracture blisters.  Marrianne Mood Dean 05/29/2020, 10:12 PM

## 2020-05-29 NOTE — Progress Notes (Signed)
Orthopedic Tech Progress Note Patient Details:  Garrett Thompson. 07/05/1975 [id number]  Ortho Devices Type of Ortho Device: Ace wrap,Stirrup splint,Short leg splint Ortho Device/Splint Interventions: Application   Post Interventions Patient Tolerated: Well Instructions Provided: Care of device   Saul Fordyce 05/29/2020, 10:03 PM

## 2020-05-29 NOTE — ED Provider Notes (Signed)
.  Sedation  Date/Time: 05/29/2020 9:59 PM Performed by: Terrilee Files, MD Authorized by: Terrilee Files, MD   Consent:    Consent obtained:  Verbal and written   Consent given by:  Patient   Risks discussed:  Allergic reaction, dysrhythmia, inadequate sedation, nausea, prolonged hypoxia resulting in organ damage, prolonged sedation necessitating reversal, respiratory compromise necessitating ventilatory assistance and intubation and vomiting   Alternatives discussed:  Analgesia without sedation, anxiolysis and regional anesthesia Universal protocol:    Procedure explained and questions answered to patient or proxy's satisfaction: yes     Relevant documents present and verified: yes     Test results available: yes     Imaging studies available: yes     Required blood products, implants, devices, and special equipment available: yes     Site/side marked: yes     Immediately prior to procedure, a time out was called: yes     Patient identity confirmed:  Verbally with patient and arm band Indications:    Procedure performed:  Fracture reduction   Procedure necessitating sedation performed by:  Different physician Pre-sedation assessment:    Time since last food or drink:  8   ASA classification: class 1 - normal, healthy patient     Mouth opening:  3 or more finger widths   Thyromental distance:  4 finger widths   Mallampati score:  I - soft palate, uvula, fauces, pillars visible   Neck mobility: normal     Pre-sedation assessments completed and reviewed: airway patency, cardiovascular function, hydration status, mental status, nausea/vomiting, pain level, respiratory function and temperature     Pre-sedation assessment completed:  05/29/2020 9:20 PM Immediate pre-procedure details:    Reassessment: Patient reassessed immediately prior to procedure     Reviewed: vital signs, relevant labs/tests and NPO status     Verified: bag valve mask available, emergency equipment available,  intubation equipment available, IV patency confirmed, oxygen available and suction available   Procedure details (see MAR for exact dosages):    Preoxygenation:  Nasal cannula   Sedation:  Propofol   Intended level of sedation: deep   Intra-procedure monitoring:  Blood pressure monitoring, cardiac monitor, continuous pulse oximetry, frequent LOC assessments, frequent vital sign checks and continuous capnometry   Intra-procedure events: none     Total Provider sedation time (minutes):  15 Post-procedure details:    Post-sedation assessment completed:  05/29/2020 10:30 PM   Attendance: Constant attendance by certified staff until patient recovered     Recovery: Patient returned to pre-procedure baseline     Post-sedation assessments completed and reviewed: airway patency, cardiovascular function, hydration status, mental status, nausea/vomiting, pain level, respiratory function and temperature     Patient is stable for discharge or admission: yes     Procedure completion:  Tolerated well, no immediate complications      Terrilee Files, MD 05/30/20 1058

## 2020-05-29 NOTE — ED Triage Notes (Signed)
Pt slipped on ice. Pt has deformity to left ankle. EMS splinted left ankle. Pt denies neck and back pain, denies LOC and denies hitting his head. Pt has hx of hypertension.

## 2020-05-29 NOTE — ED Provider Notes (Addendum)
[facility]-EMERGENCY DEPT Provider Note   CSN: [id number] Arrival date & time: 05/29/20  1749     History Chief Complaint  Patient presents with  . Ankle Pain    Left ankle deformity    Garrett Thompson. is a 45 y.o. male.  Pt reports he slipped on the ice and injured his ankle   The history is provided by the patient. No language interpreter was used.  Ankle Pain Location:  Ankle Injury: no   Ankle location:  L ankle Pain details:    Quality:  Aching   Severity:  Severe   Onset quality:  Sudden   Timing:  Constant Chronicity:  New Prior injury to area:  No Relieved by:  Nothing Worsened by:  Nothing Ineffective treatments:  None tried Associated symptoms: swelling   Risk factors: no concern for non-accidental trauma   Pt complains of pain and swelling    Past Medical History:  Diagnosis Date  . Hypertension   . Stroke [facility])    at age 3    There are no problems to display for this patient.   Past Surgical History:  Procedure Laterality Date  . neck fusion         History reviewed. No pertinent family history.  Social History   Tobacco Use  . Smoking status: Never Smoker  . Smokeless tobacco: Never Used  Substance Use Topics  . Alcohol use: No  . Drug use: No    Home Medications Prior to Admission medications   Medication Sig Start Date End Date Taking? Authorizing Provider  albuterol (PROVENTIL HFA;VENTOLIN HFA) 108 (90 BASE) MCG/ACT inhaler Inhale 2 puffs into the lungs every 4 (four) hours as needed for wheezing or shortness of breath (or coughing). 05/11/13   Dione Booze, MD  cyclobenzaprine (FLEXERIL) 5 MG tablet Take 5 mg by mouth 3 (three) times daily as needed. For spasms.    [provider]  ibuprofen (ADVIL,MOTRIN) 800 MG tablet Take 800 mg by mouth every 8 (eight) hours as needed. For headache.    [provider]  lisinopril (PRINIVIL,ZESTRIL) 5 MG tablet Take 2.5 mg by mouth daily.     [provider]  traMADol (ULTRAM) 50 MG tablet Take 50 mg by mouth every 8 (eight) hours as needed (neck pain).    [provider]    Allergies    Mobic [meloxicam]  Review of Systems   Review of Systems  Musculoskeletal: Positive for joint swelling.  Skin: Negative for color change.  All other systems reviewed and are negative.   Physical Exam Updated Vital Signs BP (!) 192/123   Pulse 97   Temp 98.7 F (37.1 C) (Oral)   Resp 16   Ht 6' (1.829 m)   Wt 88.5 kg   SpO2 99%   BMI 26.46 kg/m   Physical Exam Vitals and nursing note reviewed.  Constitutional:      Appearance: He is well-developed and well-nourished.  HENT:     Head: Normocephalic and atraumatic.  Eyes:     Conjunctiva/sclera: Conjunctivae normal.  Cardiovascular:     Rate and Rhythm: Normal rate and regular rhythm.     Heart sounds: No murmur heard.   Pulmonary:     Effort: Pulmonary effort is normal. No respiratory distress.     Breath sounds: Normal breath sounds.  Abdominal:     Palpations: Abdomen is soft.  Musculoskeletal:        General: Swelling, tenderness and deformity present.  No edema.     Cervical back: Neck supple.  Skin:    General: Skin is warm.  Neurological:     General: No focal deficit present.     Mental Status: He is alert.  Psychiatric:        Mood and Affect: Mood and affect and mood normal.     ED Results / Procedures / Treatments   Labs (all labs ordered are listed, but only abnormal results are displayed) Labs Reviewed - No data to display  EKG None  Radiology DG Ankle Complete Left  Result Date: 05/29/2020 CLINICAL DATA:  Fall EXAM: LEFT ANKLE COMPLETE - 3+ VIEW COMPARISON:  None. FINDINGS: Acute fracture involving distal shaft of the fibula with greater than 1 shaft diameter lateral and posterior displacement of distal fracture fragment in addition to moderate angulation. Acute mildly comminuted fracture involving the medial malleolus of the  distal tibia. Lateral and posterior dislocation of the talus with respect to the distal tibia. IMPRESSION: Acute displaced and angulated fracture involving distal fibula with acute mildly comminuted medial malleolar fracture. There is lateral and posterior dislocation of the talus with respect to the distal tibia. Electronically Signed   By: Jasmine Pang M.D.   On: 05/29/2020 20:31   DG Ankle Left Port  Result Date: 05/29/2020 CLINICAL DATA:  Post reduction EXAM: PORTABLE LEFT ANKLE - 2 VIEW COMPARISON:  05/29/2020 FINDINGS: Casting material limits bone detail. Reduction of previously noted ankle dislocation with more anatomic alignment. Medial malleolar fracture difficult to see through casting material. Decreased displacement and angulation of distal fibular fracture. IMPRESSION: Reduction of previously noted ankle fracture dislocation as described above. Electronically Signed   By: Jasmine Pang M.D.   On: 05/29/2020 22:10    Procedures Procedures (including critical care time)  Medications Ordered in ED Medications  propofol (DIPRIVAN) 10 mg/mL bolus/IV push 44.3 mg (44.3 mg Intravenous See Procedure Record 05/29/20 2158)  HYDROmorphone (DILAUDID) injection 1 mg (1 mg Intravenous Given 05/29/20 1845)  ondansetron (ZOFRAN) injection 4 mg (4 mg Intravenous Given 05/29/20 1845)  HYDROmorphone (DILAUDID) injection 1 mg (1 mg Intravenous Given 05/29/20 2022)  propofol (DIPRIVAN) 10 mg/mL bolus/IV push (44.25 mg Intravenous Given 05/29/20 2149)    ED Course  I have reviewed the triage vital signs and the nursing notes.  Pertinent labs & imaging results that were available during my care of the patient were reviewed by me and considered in my medical decision making (see chart for details).    MDM Rules/Calculators/A&P                          MDM:  Dr. August Saucer Orthopaedist here to see and reduce.  Post reduction xray shows good alignment.  Pt given crutches and rx for percocet.  Pt advised to call  Orthopaedist office for follow up appointment  Pt reevaluated and is wide awake.  I reviewed discharge instructions with pt. Final Clinical Impression(s) / ED Diagnoses Final diagnoses:  Closed fracture dislocation of left ankle, initial encounter    Rx / DC Orders ED Discharge Orders         Ordered    oxyCODONE-acetaminophen (PERCOCET) 5-325 MG tablet  Every 4 hours PRN        05/29/20 2227        An After Visit Summary was printed and given to the patient.    Elson Areas, PA-C 05/29/20 2228    Elson Areas, [place] 05/29/20 2233  Terrilee Files, MD 05/30/20 1119

## 2020-05-29 NOTE — Progress Notes (Signed)
Orthopedic Tech Progress Note Patient Details:  Garrett Thompson. 1975-09-06 [id number]  Ortho Devices Type of Ortho Device: Crutches Ortho Device/Splint Interventions: Application   Post Interventions Patient Tolerated: Well Instructions Provided: Care of device   Saul Fordyce 05/29/2020, 10:29 PM

## 2020-05-31 ENCOUNTER — Other Ambulatory Visit: Payer: Self-pay

## 2020-05-31 ENCOUNTER — Encounter (HOSPITAL_BASED_OUTPATIENT_CLINIC_OR_DEPARTMENT_OTHER): Payer: Self-pay | Admitting: Orthopedic Surgery

## 2020-05-31 ENCOUNTER — Telehealth: Payer: Self-pay | Admitting: Orthopedic Surgery

## 2020-05-31 NOTE — Telephone Encounter (Signed)
Please obtain updated phone number. Have orders to schedule left ankle surgery on Monday, but unable to reach patient with the number provided.

## 2020-06-01 ENCOUNTER — Encounter (HOSPITAL_BASED_OUTPATIENT_CLINIC_OR_DEPARTMENT_OTHER)
Admission: RE | Admit: 2020-06-01 | Discharge: 2020-06-01 | Disposition: A | Discharge status: Home / Self Care | Payer: No Typology Code available for payment source | Source: Ambulatory Visit | Attending: Orthopedic Surgery | Admitting: Orthopedic Surgery

## 2020-06-01 ENCOUNTER — Other Ambulatory Visit (HOSPITAL_COMMUNITY)
Admission: RE | Admit: 2020-06-01 | Discharge: 2020-06-01 | Disposition: A | Discharge status: Home / Self Care | Payer: No Typology Code available for payment source | Source: Ambulatory Visit | Attending: Orthopedic Surgery | Admitting: Orthopedic Surgery

## 2020-06-01 ENCOUNTER — Ambulatory Visit (INDEPENDENT_AMBULATORY_CARE_PROVIDER_SITE_OTHER): Payer: No Typology Code available for payment source | Admitting: Surgical

## 2020-06-01 DIAGNOSIS — Z01818 Encounter for other preprocedural examination: Secondary | ICD-10-CM | POA: Diagnosis present

## 2020-06-01 DIAGNOSIS — S82892A Other fracture of left lower leg, initial encounter for closed fracture: Secondary | ICD-10-CM

## 2020-06-01 DIAGNOSIS — Z20822 Contact with and (suspected) exposure to covid-19: Secondary | ICD-10-CM | POA: Insufficient documentation

## 2020-06-01 DIAGNOSIS — Z01812 Encounter for preprocedural laboratory examination: Secondary | ICD-10-CM | POA: Insufficient documentation

## 2020-06-01 LAB — BASIC METABOLIC PANEL
Anion gap: 9 (ref 5–15)
BUN: 14 mg/dL (ref 6–20)
CO2: 26 mmol/L (ref 22–32)
Calcium: 8.6 mg/dL — ABNORMAL LOW (ref 8.9–10.3)
Chloride: 105 mmol/L (ref 98–111)
Creatinine, Ser: 1.94 mg/dL — ABNORMAL HIGH (ref 0.61–1.24)
GFR, Estimated: 43 mL/min — ABNORMAL LOW (ref >60)
Glucose, Bld: 112 mg/dL — ABNORMAL HIGH (ref 70–99)
Potassium: 3.7 mmol/L (ref 3.5–5.1)
Sodium: 140 mmol/L (ref 135–145)

## 2020-06-01 LAB — CBC
HCT: 36.1 % — ABNORMAL LOW (ref 39.0–52.0)
Hemoglobin: 12.4 g/dL — ABNORMAL LOW (ref 13.0–17.0)
MCH: 27.9 pg (ref 26.0–34.0)
MCHC: 34.3 g/dL (ref 30.0–36.0)
MCV: 81.3 fL (ref 80.0–100.0)
Platelets: 243 10*3/uL (ref 150–400)
RBC: 4.44 MIL/uL (ref 4.22–5.81)
RDW: 13.2 % (ref 11.5–15.5)
WBC: 6.7 10*3/uL (ref 4.0–10.5)
nRBC: 0 % (ref 0.0–0.2)

## 2020-06-01 LAB — SARS CORONAVIRUS 2 (TAT 6-24 HRS): SARS Coronavirus 2: NEGATIVE

## 2020-06-01 NOTE — Progress Notes (Signed)

## 2020-06-04 ENCOUNTER — Other Ambulatory Visit (HOSPITAL_COMMUNITY): Payer: No Typology Code available for payment source

## 2020-06-04 ENCOUNTER — Encounter: Payer: Self-pay | Admitting: Surgical

## 2020-06-04 NOTE — Progress Notes (Signed)
Office Visit Note   Patient: Garrett Thompson.           Date of Birth: Nov 20, 1975           MRN: [id number] Visit Date: 06/01/2020 Requested by: [facility] [address] [facility] [place],  [place] [address] PCP: [facility]  Subjective: Chief Complaint  Patient presents with  . Other    Skin check prior to surgery on Tuesday    HPI: Garrett Thompson. is a 45 y.o. male who presents to the office complaining of left ankle pain.  Patient injured his ankle on 05/29/2020 after slipping and falling on the ice.  He sustained left ankle fracture/dislocation and reported to [facility] department.  Left ankle was reduced by Dr. August Saucer in the ED and he was told to follow-up.  Today he reports that his pain is improved.  He has remained in splint since reduction and is nonweightbearing on the left ankle..                ROS: All systems reviewed are negative as they relate to the chief complaint within the history of present illness.  Patient denies fevers or chills.  Assessment & Plan: Visit Diagnoses:  1. Closed fracture of left ankle, initial encounter     Plan: Patient is a 45 year old male who presents following left ankle fracture dislocation.  Sustained injury after slipping on ice on 05/29/2020.  Left ankle was reduced by Dr. August Saucer in the ED.  Left ankle fracture is bimalleolar fracture with injury to the medial and lateral malleoli.  He has remained in splint since the reduction.  No significant skin injury today but he does have moderate swelling.  Placed patient back in a new splint and recommended that he elevate and ice the ankle.  He is currently posted for left ankle ORIF on 06/05/2020.  Follow-up after procedure.  Follow-Up Instructions: No follow-ups on file.   Orders:  No orders of the defined types were placed in this encounter.  No orders of the defined types were placed in this encounter.     Procedures: No procedures  performed   Clinical Data: No additional findings.  Objective: Vital Signs: There were no vitals taken for this visit.  Physical Exam:  Constitutional: Patient appears well-developed HEENT:  Head: Normocephalic Eyes:EOM are normal Neck: Normal range of motion Cardiovascular: Normal rate Pulmonary/chest: Effort normal Neurologic: Patient is alert Skin: Skin is warm Psychiatric: Patient has normal mood and affect  Ortho Exam: Ortho exam demonstrates left ankle with moderate swelling surrounding the medial and lateral malleoli.  No fracture blisters noted.  No skin injury noted.  2+ DP pulse of the left lower extremity.  Able to actively dorsiflex and plantarflex the ankle and flex and extend the great toe.  Syndesmosis stable on exam today.  No calf tenderness.  Negative Homans' sign.  No significant left knee effusion.  Specialty Comments:  No specialty comments available.  Imaging: No results found.   PMFS History: There are no problems to display for this patient.  Past Medical History:  Diagnosis Date  . GERD (gastroesophageal reflux disease)   . Hypertension   . Stroke [facility])    at age 41    No family history on file.  Past Surgical History:  Procedure Laterality Date  . neck fusion    . SPINAL FUSION  2009   Social History   Occupational History  . Not on file  Tobacco Use  . Smoking status: Never Smoker  . Smokeless tobacco: Never Used  Substance and Sexual Activity  . Alcohol use: No  . Drug use: No  . Sexual activity: Not on file

## 2020-06-05 ENCOUNTER — Ambulatory Visit (HOSPITAL_BASED_OUTPATIENT_CLINIC_OR_DEPARTMENT_OTHER): Payer: No Typology Code available for payment source | Admitting: Anesthesiology

## 2020-06-05 ENCOUNTER — Encounter (HOSPITAL_BASED_OUTPATIENT_CLINIC_OR_DEPARTMENT_OTHER): Payer: Self-pay | Admitting: Orthopedic Surgery

## 2020-06-05 ENCOUNTER — Other Ambulatory Visit: Payer: Self-pay

## 2020-06-05 ENCOUNTER — Ambulatory Visit (HOSPITAL_COMMUNITY): Payer: No Typology Code available for payment source

## 2020-06-05 ENCOUNTER — Ambulatory Visit (HOSPITAL_BASED_OUTPATIENT_CLINIC_OR_DEPARTMENT_OTHER)
Admission: RE | Admit: 2020-06-05 | Discharge: 2020-06-05 | Disposition: A | Discharge status: Home / Self Care | Payer: No Typology Code available for payment source | Attending: Orthopedic Surgery | Admitting: Orthopedic Surgery

## 2020-06-05 ENCOUNTER — Encounter (HOSPITAL_BASED_OUTPATIENT_CLINIC_OR_DEPARTMENT_OTHER)
Admission: RE | Disposition: A | Discharge status: Home / Self Care | Payer: Self-pay | Source: Home / Self Care | Attending: Orthopedic Surgery

## 2020-06-05 DIAGNOSIS — Z888 Allergy status to other drugs, medicaments and biological substances status: Secondary | ICD-10-CM | POA: Insufficient documentation

## 2020-06-05 DIAGNOSIS — Z79899 Other long term (current) drug therapy: Secondary | ICD-10-CM | POA: Diagnosis not present

## 2020-06-05 DIAGNOSIS — Z419 Encounter for procedure for purposes other than remedying health state, unspecified: Secondary | ICD-10-CM

## 2020-06-05 DIAGNOSIS — S8262XG Displaced fracture of lateral malleolus of left fibula, subsequent encounter for closed fracture with delayed healing: Secondary | ICD-10-CM

## 2020-06-05 DIAGNOSIS — Z8673 Personal history of transient ischemic attack (TIA), and cerebral infarction without residual deficits: Secondary | ICD-10-CM | POA: Insufficient documentation

## 2020-06-05 DIAGNOSIS — X58XXXA Exposure to other specified factors, initial encounter: Secondary | ICD-10-CM | POA: Insufficient documentation

## 2020-06-05 DIAGNOSIS — S82842A Displaced bimalleolar fracture of left lower leg, initial encounter for closed fracture: Secondary | ICD-10-CM | POA: Insufficient documentation

## 2020-06-05 HISTORY — DX: Gastro-esophageal reflux disease without esophagitis: K21.9

## 2020-06-05 HISTORY — PX: ORIF ANKLE FRACTURE: SHX5408

## 2020-06-05 SURGERY — OPEN REDUCTION INTERNAL FIXATION (ORIF) ANKLE FRACTURE
Anesthesia: General | Site: Ankle | Laterality: Left

## 2020-06-05 MED ORDER — BUPIVACAINE-EPINEPHRINE (PF) 0.5% -1:200000 IJ SOLN
INTRAMUSCULAR | Status: DC | PRN
  Administered 2020-06-05: 15 mL via PERINEURAL
  Administered 2020-06-05: 30 mL via PERINEURAL

## 2020-06-05 MED ORDER — ONDANSETRON HCL 4 MG/2ML IJ SOLN
INTRAMUSCULAR | Status: AC
  Filled 2020-06-05: qty 2

## 2020-06-05 MED ORDER — ACETAMINOPHEN 500 MG PO TABS
ORAL_TABLET | ORAL | Status: AC
  Filled 2020-06-05: qty 2

## 2020-06-05 MED ORDER — PROPOFOL 10 MG/ML IV BOLUS
INTRAVENOUS | Status: AC
  Filled 2020-06-05: qty 20

## 2020-06-05 MED ORDER — ASPIRIN 81 MG PO CHEW: 81.0000 mg | CHEWABLE_TABLET | Freq: Two times a day (BID) | ORAL | 0 refills | Status: AC

## 2020-06-05 MED ORDER — ACETAMINOPHEN 500 MG PO TABS
1000.0000 mg | ORAL_TABLET | Freq: Once | ORAL | Status: AC
  Administered 2020-06-05: 1000 mg via ORAL

## 2020-06-05 MED ORDER — DEXAMETHASONE SODIUM PHOSPHATE 10 MG/ML IJ SOLN
INTRAMUSCULAR | Status: DC | PRN
  Administered 2020-06-05: 4 mg via INTRAVENOUS

## 2020-06-05 MED ORDER — ONDANSETRON HCL 4 MG/2ML IJ SOLN
INTRAMUSCULAR | Status: DC | PRN
  Administered 2020-06-05: 4 mg via INTRAVENOUS

## 2020-06-05 MED ORDER — CEFAZOLIN SODIUM-DEXTROSE 2-4 GM/100ML-% IV SOLN
2.0000 g | INTRAVENOUS | Status: AC
  Administered 2020-06-05: 2 g via INTRAVENOUS

## 2020-06-05 MED ORDER — CEFAZOLIN SODIUM-DEXTROSE 2-4 GM/100ML-% IV SOLN
INTRAVENOUS | Status: AC
  Filled 2020-06-05: qty 100

## 2020-06-05 MED ORDER — FENTANYL CITRATE (PF) 100 MCG/2ML IJ SOLN
100.0000 ug | Freq: Once | INTRAMUSCULAR | Status: AC
  Administered 2020-06-05: 100 ug via INTRAVENOUS

## 2020-06-05 MED ORDER — FENTANYL CITRATE (PF) 100 MCG/2ML IJ SOLN
INTRAMUSCULAR | Status: AC
  Filled 2020-06-05: qty 2

## 2020-06-05 MED ORDER — MIDAZOLAM HCL 2 MG/2ML IJ SOLN
2.0000 mg | Freq: Once | INTRAMUSCULAR | Status: AC
  Administered 2020-06-05: 2 mg via INTRAVENOUS

## 2020-06-05 MED ORDER — VANCOMYCIN HCL 500 MG IV SOLR
INTRAVENOUS | Status: DC | PRN
  Administered 2020-06-05: 500 mg via TOPICAL

## 2020-06-05 MED ORDER — FENTANYL CITRATE (PF) 100 MCG/2ML IJ SOLN
INTRAMUSCULAR | Status: DC | PRN
  Administered 2020-06-05: 50 ug via INTRAVENOUS
  Administered 2020-06-05 (×2): 25 ug via INTRAVENOUS

## 2020-06-05 MED ORDER — LIDOCAINE 2% (20 MG/ML) 5 ML SYRINGE
INTRAMUSCULAR | Status: AC
  Filled 2020-06-05: qty 5

## 2020-06-05 MED ORDER — VANCOMYCIN HCL 500 MG IV SOLR
INTRAVENOUS | Status: AC
  Filled 2020-06-05: qty 500

## 2020-06-05 MED ORDER — OXYCODONE-ACETAMINOPHEN 5-325 MG PO TABS: 1.0000 | ORAL_TABLET | ORAL | 0 refills | Status: DC | PRN

## 2020-06-05 MED ORDER — MIDAZOLAM HCL 2 MG/2ML IJ SOLN
INTRAMUSCULAR | Status: AC
  Filled 2020-06-05: qty 2

## 2020-06-05 MED ORDER — ROCURONIUM BROMIDE 10 MG/ML (PF) SYRINGE
PREFILLED_SYRINGE | INTRAVENOUS | Status: AC
  Filled 2020-06-05: qty 10

## 2020-06-05 MED ORDER — LIDOCAINE HCL (CARDIAC) PF 100 MG/5ML IV SOSY
PREFILLED_SYRINGE | INTRAVENOUS | Status: DC | PRN
  Administered 2020-06-05: 80 mg via INTRATRACHEAL

## 2020-06-05 MED ORDER — LACTATED RINGERS IV SOLN: INTRAVENOUS | Status: DC

## 2020-06-05 MED ORDER — PROPOFOL 10 MG/ML IV BOLUS
INTRAVENOUS | Status: DC | PRN
  Administered 2020-06-05: 200 mg via INTRAVENOUS

## 2020-06-05 MED ORDER — DEXAMETHASONE SODIUM PHOSPHATE 10 MG/ML IJ SOLN
INTRAMUSCULAR | Status: AC
  Filled 2020-06-05: qty 1

## 2020-06-05 MED ORDER — RIVAROXABAN 10 MG PO TABS: 10.0000 mg | ORAL_TABLET | Freq: Every day | ORAL | 0 refills | Status: DC

## 2020-06-05 MED ORDER — POVIDONE-IODINE 7.5 % EX SOLN
Freq: Once | CUTANEOUS | Status: AC
  Filled 2020-06-05: qty 118

## 2020-06-05 MED ORDER — HYDROMORPHONE HCL 1 MG/ML IJ SOLN: 0.2500 mg | INTRAMUSCULAR | Status: DC | PRN

## 2020-06-05 MED ORDER — GABAPENTIN 300 MG PO CAPS: 300.0000 mg | ORAL_CAPSULE | Freq: Three times a day (TID) | ORAL | 0 refills | Status: DC

## 2020-06-05 MED ORDER — POVIDONE-IODINE 10 % EX SWAB: 2.0000 "application " | Freq: Once | CUTANEOUS | Status: DC

## 2020-06-05 SURGICAL SUPPLY — 83 items
BIT DRILL OVR 3.5AO QC SHRT SM (DRILL) IMPLANT
BIT DRILL QC 2.0 SHORT EVOS SM (DRILL) IMPLANT
BIT DRILL QC 2.5MM SHRT EVO SM (DRILL) IMPLANT
BLADE SURG 15 STRL LF DISP TIS (BLADE) ×2 IMPLANT
BLADE SURG 15 STRL SS (BLADE) ×4
BNDG CMPR 9X4 STRL LF SNTH (GAUZE/BANDAGES/DRESSINGS) ×1
BNDG ELASTIC 4X5.8 VLCR STR LF (GAUZE/BANDAGES/DRESSINGS) ×1 IMPLANT
BNDG ELASTIC 6X5.8 VLCR STR LF (GAUZE/BANDAGES/DRESSINGS) ×1 IMPLANT
BNDG ESMARK 4X9 LF (GAUZE/BANDAGES/DRESSINGS) ×2 IMPLANT
CANISTER SUCT 1200ML W/VALVE (MISCELLANEOUS) IMPLANT
COVER BACK TABLE 60X90IN (DRAPES) ×3 IMPLANT
COVER MAYO STAND STRL (DRAPES) ×1 IMPLANT
COVER WAND RF STERILE (DRAPES) IMPLANT
DECANTER SPIKE VIAL GLASS SM (MISCELLANEOUS) IMPLANT
DRAPE C-ARMOR (DRAPES) ×1 IMPLANT
DRAPE EXTREMITY T 121X128X90 (DISPOSABLE) ×2 IMPLANT
DRAPE IMP U-DRAPE 54X76 (DRAPES) ×2 IMPLANT
DRAPE INCISE IOBAN 66X45 STRL (DRAPES) ×2 IMPLANT
DRAPE OEC MINIVIEW 54X84 (DRAPES) ×1 IMPLANT
DRAPE SURG 17X23 STRL (DRAPES) IMPLANT
DRAPE U-SHAPE 47X51 STRL (DRAPES) ×2 IMPLANT
DRILL OVER 3.5 AO QC SHORT SM (DRILL) ×2
DRILL QC 2.0 SHORT EVOS SM (DRILL) ×2
DRILL QC 2.5MM SHORT EVOS SM (DRILL) ×2
DRSG AQUACEL AG ADV 3.5X10 (GAUZE/BANDAGES/DRESSINGS) ×1 IMPLANT
DRSG EMULSION OIL 3X3 NADH (GAUZE/BANDAGES/DRESSINGS) IMPLANT
DRSG PAD ABDOMINAL 8X10 ST (GAUZE/BANDAGES/DRESSINGS) ×7 IMPLANT
DURAPREP 26ML APPLICATOR (WOUND CARE) ×2 IMPLANT
ELECT REM PT RETURN 9FT ADLT (ELECTROSURGICAL) ×2
ELECTRODE REM PT RTRN 9FT ADLT (ELECTROSURGICAL) ×1 IMPLANT
GAUZE 4X4 16PLY RFD (DISPOSABLE) IMPLANT
GAUZE SPONGE 4X4 12PLY STRL (GAUZE/BANDAGES/DRESSINGS) ×2 IMPLANT
GAUZE XEROFORM 1X8 LF (GAUZE/BANDAGES/DRESSINGS) IMPLANT
GLOVE BIO SURGEON STRL SZ7 (GLOVE) IMPLANT
GLOVE BIO SURGEON STRL SZ8 (GLOVE) ×2 IMPLANT
GLOVE ECLIPSE 7.0 STRL STRAW (GLOVE) ×1 IMPLANT
GLOVE ECLIPSE 8.0 STRL XLNG CF (GLOVE) ×1 IMPLANT
GLOVE SRG 8 PF TXTR STRL LF DI (GLOVE) ×2 IMPLANT
GLOVE SURG UNDER POLY LF SZ8 (GLOVE) ×2
GOWN STRL REUS W/ TWL LRG LVL3 (GOWN DISPOSABLE) ×1 IMPLANT
GOWN STRL REUS W/TWL LRG LVL3 (GOWN DISPOSABLE) ×4
NEEDLE HYPO 22GX1.5 SAFETY (NEEDLE) IMPLANT
NS IRRIG 1000ML POUR BTL (IV SOLUTION) ×2 IMPLANT
PACK BASIN DAY SURGERY FS (CUSTOM PROCEDURE TRAY) ×2 IMPLANT
PAD CAST 4YDX4 CTTN HI CHSV (CAST SUPPLIES) IMPLANT
PADDING CAST ABS 4INX4YD NS (CAST SUPPLIES) ×1
PADDING CAST ABS COTTON 4X4 ST (CAST SUPPLIES) ×1 IMPLANT
PADDING CAST COTTON 4X4 STRL (CAST SUPPLIES)
PADDING CAST COTTON 6X4 STRL (CAST SUPPLIES) IMPLANT
PENCIL SMOKE EVACUATOR (MISCELLANEOUS) ×2 IMPLANT
PLATE 5H L 81MM FIBULA EVOS (Plate) ×1 IMPLANT
SCREW CANC 4.0X18 (Screw) ×2 IMPLANT
SCREW CANC T8 FT 18X4X4.5X (Screw) IMPLANT
SCREW CORT 2.7X10 STAR T8 EVOS (Screw) ×1 IMPLANT
SCREW CORT 2.7X16 STAR T8 EVOS (Screw) ×2 IMPLANT
SCREW CORT 2.7X17 T8 ST EVOS (Screw) ×2 IMPLANT
SCREW CORT 3.5X11 ST EVOS (Screw) ×2 IMPLANT
SCREW CORT 3.5X13MM (Screw) ×2 IMPLANT
SCREW CORT 3.5X14 ST EVOS (Screw) ×1 IMPLANT
SCREW CORT 3.5X22 ST EVOS (Screw) ×1 IMPLANT
SCREW CORTEX 3.5X24MM (Screw) ×1 IMPLANT
SCREW EVOS 2.7X11 LOCK T8 (Screw) ×1 IMPLANT
SHEET MEDIUM DRAPE 40X70 STRL (DRAPES) ×2 IMPLANT
SLEEVE SCD COMPRESS KNEE MED (MISCELLANEOUS) ×1 IMPLANT
SPLINT FAST PLASTER 5X30 (CAST SUPPLIES) ×10
SPLINT PLASTER CAST FAST 5X30 (CAST SUPPLIES) IMPLANT
SPLINT PLASTER CAST XFAST 3X15 (CAST SUPPLIES) IMPLANT
SPLINT PLASTER CAST XFAST 4X15 (CAST SUPPLIES) IMPLANT
SPLINT PLASTER XTRA FAST SET 4 (CAST SUPPLIES)
SPLINT PLASTER XTRA FASTSET 3X (CAST SUPPLIES)
STOCKINETTE IMPERVIOUS LG (DRAPES) ×3 IMPLANT
SUCTION FRAZIER HANDLE 10FR (MISCELLANEOUS) ×2
SUCTION TUBE FRAZIER 10FR DISP (MISCELLANEOUS) ×1 IMPLANT
SUT ETHILON 3 0 PS 1 (SUTURE) ×5 IMPLANT
SUT VIC AB 2-0 CT1 27 (SUTURE) ×2
SUT VIC AB 2-0 CT1 TAPERPNT 27 (SUTURE) ×1 IMPLANT
SUT VIC AB 3-0 CT1 27 (SUTURE)
SUT VIC AB 3-0 CT1 27XBRD (SUTURE) ×1 IMPLANT
SYR BULB EAR ULCER 3OZ GRN STR (SYRINGE) ×2 IMPLANT
SYR CONTROL 10ML LL (SYRINGE) IMPLANT
TOWEL GREEN STERILE FF (TOWEL DISPOSABLE) ×4 IMPLANT
TUBE CONNECTING 20X1/4 (TUBING) ×2 IMPLANT
UNDERPAD 30X36 HEAVY ABSORB (UNDERPADS AND DIAPERS) ×2 IMPLANT

## 2020-06-05 NOTE — Anesthesia Procedure Notes (Signed)
Anesthesia Regional Block: Popliteal block   Pre-Anesthetic Checklist: ,, timeout performed, Correct Patient, Correct Site, Correct Laterality, Correct Procedure, Correct Position, site marked, Risks and benefits discussed, pre-op evaluation,  At surgeon's request and post-op pain management  Laterality: Left  Prep: Maximum Sterile Barrier Precautions used, chloraprep       Needles:  Injection technique: Single-shot  Needle Type: Echogenic Stimulator Needle     Needle Length: 9cm  Needle Gauge: 21     Additional Needles:   Procedures:,,,, ultrasound used (permanent image in chart),,,,  Narrative:  Start time: 06/05/2020 8:35 AM End time: 06/05/2020 8:45 AM Injection made incrementally with aspirations every 5 mL. Anesthesiologist: Gaynelle Adu, MD  Additional Notes: 2% Lidocaine skin wheel. Adductor canal block with 15cc of 0.5% Bupivicaine w/1:200k epi.

## 2020-06-05 NOTE — H&P (Signed)
Garrett Thompson. is an 45 y.o. male.   Chief Complaint: Left ankle pain HPI: Patient presented to the emergency department last week with ankle fracture dislocation.  That fracture was reduced.  He presents now for operative management of unstable ankle.  Patient has been elevating the ankle.  No personal or family history of DVT or pulmonary embolism.  Past Medical History:  Diagnosis Date  . GERD (gastroesophageal reflux disease)   . Hypertension   . Stroke [facility])    at age 55    Past Surgical History:  Procedure Laterality Date  . neck fusion    . SPINAL FUSION  2009    History reviewed. No pertinent family history. Social History:  reports that he has never smoked. He has never used smokeless tobacco. He reports that he does not drink alcohol and does not use drugs.  Allergies:  Allergies  Allergen Reactions  . Mobic [Meloxicam]     Nose Bleed  . Mushroom Extract Complex   . Shellfish Allergy     Medications Prior to Admission  Medication Sig Dispense Refill  . cyclobenzaprine (FLEXERIL) 5 MG tablet Take 5 mg by mouth 3 (three) times daily as needed. For spasms.    Marland Kitchen ibuprofen (ADVIL,MOTRIN) 800 MG tablet Take 800 mg by mouth every 8 (eight) hours as needed. For headache.    . lisinopril (PRINIVIL,ZESTRIL) 5 MG tablet Take 2.5 mg by mouth daily.    Marland Kitchen omeprazole (PRILOSEC) 10 MG capsule Take 20 mg by mouth daily.    Marland Kitchen oxyCODONE-acetaminophen (PERCOCET) 5-325 MG tablet Take 1 tablet by mouth every 4 (four) hours as needed for severe pain. 30 tablet 0  . albuterol (PROVENTIL HFA;VENTOLIN HFA) 108 (90 BASE) MCG/ACT inhaler Inhale 2 puffs into the lungs every 4 (four) hours as needed for wheezing or shortness of breath (or coughing). 1 Inhaler 0    No results found for this or any previous visit (from the past 48 hour(s)). No results found.  Review of Systems  Musculoskeletal: Positive for arthralgias.  All other systems reviewed and are negative.   Blood pressure (!)  175/114, pulse 91, temperature 98.6 F (37 C), resp. rate 12, height 6\' 1"  (1.854 m), weight 109.4 kg, SpO2 100 %. Physical Exam Vitals reviewed.  HENT:     Head: Normocephalic.     Nose: Nose normal.     Mouth/Throat:     Mouth: Mucous membranes are moist.  Eyes:     Pupils: Pupils are equal, round, and reactive to light.  Cardiovascular:     Rate and Rhythm: Normal rate.     Pulses: Normal pulses.  Pulmonary:     Effort: Pulmonary effort is normal.  Abdominal:     General: Abdomen is flat.  Musculoskeletal:     Cervical back: Normal range of motion.  Skin:    General: Skin is warm.     Capillary Refill: Capillary refill takes less than 2 seconds.  Neurological:     General: No focal deficit present.     Mental Status: He is alert.  Psychiatric:        Mood and Affect: Mood normal.     Examination of the left ankle demonstrates intact pulses.  Some swelling is present.  No fracture blisters present.  Compartments are soft.  Foot is perfused and sensate. Assessment/Plan Impression is left ankle fracture dislocation now reduced as of last week.  Patient's been elevating the ankle.  Plan is open reduction internal fixation of  ankle fracture.  Risk and benefits are discussed with the patient include not limited to infection nerve vessel damage ankle arthritis malunion nonunion.  All questions answered.  Burnard Bunting, MD 06/05/2020, 8:52 AM

## 2020-06-05 NOTE — Anesthesia Postprocedure Evaluation (Signed)
Anesthesia Post Note  Patient: Garrett Thompson.  Procedure(s) Performed: LEFT OPEN REDUCTION INTERNAL FIXATION (ORIF) ANKLE FRACTURE (Left Ankle)     Patient location during evaluation: PACU Anesthesia Type: General and Regional Level of consciousness: awake and alert Pain management: pain level controlled Vital Signs Assessment: post-procedure vital signs reviewed and stable Respiratory status: spontaneous breathing, nonlabored ventilation and respiratory function stable Cardiovascular status: blood pressure returned to baseline and stable Postop Assessment: no apparent nausea or vomiting Anesthetic complications: no   No complications documented.  Last Vitals:  Vitals:   06/05/20 1126 06/05/20 1140  BP:  (!) 154/91  Pulse:  93  Resp:  15  Temp:  36.6 C  SpO2: 95% 96%    Last Pain:  Vitals:   06/05/20 1052  PainSc: Asleep                 Martavion Couper,W. EDMOND

## 2020-06-05 NOTE — Progress Notes (Signed)
Assisted Dr. Edmond Fitzgerald with left, ultrasound guided, popliteal/saphenous block. Side rails up, monitors on throughout procedure. See vital signs in flow sheet. Tolerated Procedure well. 

## 2020-06-05 NOTE — Transfer of Care (Signed)
Immediate Anesthesia Transfer of Care Note  Patient: Garrett Thompson.  Procedure(s) Performed: LEFT OPEN REDUCTION INTERNAL FIXATION (ORIF) ANKLE FRACTURE (Left Ankle)  Patient Location: PACU  Anesthesia Type:GA combined with regional for post-op pain  Level of Consciousness: sedated  Airway & Oxygen Therapy: Patient Spontanous Breathing and Patient connected to face mask oxygen  Post-op Assessment: Report given to RN and Post -op Vital signs reviewed and stable  Post vital signs: Reviewed and stable  Last Vitals:  Vitals Value Taken Time  BP 130/80 06/05/20 1052  Temp    Pulse 91 06/05/20 1053  Resp 10 06/05/20 1053  SpO2 98 % 06/05/20 1053  Vitals shown include unvalidated device data.  Last Pain:  Vitals:   06/05/20 0825  PainSc: 10-Worst pain ever      Patients Stated Pain Goal: 5 (06/05/20 0825)  Complications: No complications documented.

## 2020-06-05 NOTE — Discharge Instructions (Signed)
May take Tylenol after 2:30 pm, if needed.   Post Anesthesia Home Care Instructions  Activity: Get plenty of rest for the remainder of the day. A responsible individual must stay with you for 24 hours following the procedure.  For the next 24 hours, DO NOT: -Drive a car -Advertising copywriter -Drink alcoholic beverages -Take any medication unless instructed by your physician -Make any legal decisions or sign important papers.  Meals: Start with liquid foods such as gelatin or soup. Progress to regular foods as tolerated. Avoid greasy, spicy, heavy foods. If nausea and/or vomiting occur, drink only clear liquids until the nausea and/or vomiting subsides. Call your physician if vomiting continues.  Special Instructions/Symptoms: Your throat may feel dry or sore from the anesthesia or the breathing tube placed in your throat during surgery. If this causes discomfort, gargle with warm salt water. The discomfort should disappear within 24 hours.  If you had a scopolamine patch placed behind your ear for the management of post- operative nausea and/or vomiting:  1. The medication in the patch is effective for 72 hours, after which it should be removed.  Wrap patch in a tissue and discard in the trash. Wash hands thoroughly with soap and water. 2. You may remove the patch earlier than 72 hours if you experience unpleasant side effects which may include dry mouth, dizziness or visual disturbances. 3. Avoid touching the patch. Wash your hands with soap and water after contact with the patch.    Regional Anesthesia Blocks  1. Numbness or the inability to move the "blocked" extremity may last from 3-48 hours after placement. The length of time depends on the medication injected and your individual response to the medication. If the numbness is not going away after 48 hours, call your surgeon.  2. The extremity that is blocked will need to be protected until the numbness is gone and the  Strength has  returned. Because you cannot feel it, you will need to take extra care to avoid injury. Because it may be weak, you may have difficulty moving it or using it. You may not know what position it is in without looking at it while the block is in effect.  3. For blocks in the legs and feet, returning to weight bearing and walking needs to be done carefully. You will need to wait until the numbness is entirely gone and the strength has returned. You should be able to move your leg and foot normally before you try and bear weight or walk. You will need someone to be with you when you first try to ensure you do not fall and possibly risk injury.  4. Bruising and tenderness at the needle site are common side effects and will resolve in a few days.  5. Persistent numbness or new problems with movement should be communicated to the surgeon or the [facility] [phone] [facility] [phone]).

## 2020-06-05 NOTE — Anesthesia Procedure Notes (Signed)
Procedure Name: LMA Insertion Date/Time: 06/05/2020 9:08 AM Performed by: Thornell Mule, CRNA Pre-anesthesia Checklist: Patient identified, Emergency Drugs available, Suction available and Patient being monitored Patient Re-evaluated:Patient Re-evaluated prior to induction Oxygen Delivery Method: Circle system utilized Preoxygenation: Pre-oxygenation with 100% oxygen Induction Type: IV induction Ventilation: Mask ventilation without difficulty LMA: LMA inserted LMA Size: 5.0 Number of attempts: 1 Placement Confirmation: positive ETCO2 Tube secured with: Tape Dental Injury: Teeth and Oropharynx as per pre-operative assessment

## 2020-06-05 NOTE — Brief Op Note (Signed)
   06/05/2020  11:09 AM  PATIENT:  Garrett Thompson.  45 y.o. male  PRE-OPERATIVE DIAGNOSIS:  left ankle bimalleolar ankle fracture  POST-OPERATIVE DIAGNOSIS:  left ankle lateral malleoloa ankle fracture  PROCEDURE:  Procedure(s): LEFT OPEN REDUCTION INTERNAL FIXATION (ORIF) ANKLE FRACTURE  SURGEON:  Surgeon(s): Cammy Copa, MD  ASSISTANT:magnant pa  ANESTHESIA:   general  EBL: 10 ml    Total I/O In: 850 [I.V.:750; IV Piggyback:100] Out: 10 [Blood:10]  BLOOD ADMINISTERED: none  DRAINS: none   LOCAL MEDICATIONS USED:  vanco powder  SPECIMEN:  No Specimen  COUNTS:  YES  TOURNIQUET:  * Missing tourniquet times found for documented tourniquets in log: [id number] *  DICTATION: .Other Dictation: Dictation Number [id number]  PLAN OF CARE: Discharge to home after PACU  PATIENT DISPOSITION:  PACU - hemodynamically stable

## 2020-06-05 NOTE — Op Note (Signed)
NAME: Garrett Thompson, Garrett Thompson MEDICAL RECORD FU:[id number] ACCOUNT [id number] DATE OF BIRTH:[birth year] FACILITY: [facility] LOCATION: [facility] PHYSICIAN:Akeen Ledyard Diamantina Providence, MD  OPERATIVE REPORT  DATE OF PROCEDURE:  06/05/2020  PREOPERATIVE DIAGNOSIS:  Left ankle fracture dislocation.  POSTOPERATIVE DIAGNOSIS:  Left ankle fracture dislocation.  PROCEDURE:  Left ankle fracture dislocation, open reduction internal fixation of lateral malleolus.  SURGEON:  Cammy Copa, MD  ASSISTANT:  Karenann Cai, PA.  INDICATIONS:  This is a 45 year old patient who sustained a left ankle fracture dislocation last week.  It was reduced in the Emergency Department.  Presents now for operative management after explanation of risks and benefits.  PROCEDURE IN DETAIL:  The patient was brought to the operating room where general anesthetic was induced.  Preoperative antibiotics administered.  Timeout was called.  Left leg prescrubbed with alcohol and Betadine, allowed to air dry, prepped with  DuraPrep solution and draped in a sterile manner.  Ioban used to cover the operative field.  The leg was elevated and the ankle Esmarch was utilized.  Incision made over the lateral malleolus.  Skin and subcutaneous tissue were sharply divided.  Care was  taken to avoid injury to the superficial peroneal nerve.  Fracture was identified and irrigated and reduced.  Held in place with reduction forceps.  Lag screw from [employer] placed proximal anterior to distal posterior.  Good reduction was  achieved.  A 5-hole plate was then applied with locking screws distally and nonlocking screws proximally.  Bone quality was excellent.  After application of the plate the ankle was examined manually as well as fluoroscopically.  The patient had small  avulsion fracture off the medial malleolus, but there was no large repairable fracture.  Syndesmosis was also stable.  Due to the deltoid ligament injury, there was about 1-2 mm of  mildly asymmetric mortise laxity, but the syndesmosis itself was stable.   This was consistent with preoperative radiographs indicating competency of that ligament between the distal tibia and distal fibula.  Following plate fixation of that lateral malleolus and examination under anesthesia, ankle Esmarch was released.   Thorough irrigation was performed.  Vancomycin powder placed on the plate.  Periosteum was then closed over the plate using 2-0 Vicryl followed by interrupted inverted 2-0 Vicryl _____ the skin and then 3-0 nylon sutures to close up the skin edges.   Aquacel dressing and a well-padded posterior splint applied.  The patient tolerated the procedure well without immediate complication.  Luke's assistance was required for opening and closing, mobilization of tissue.  His assistance was a medical  necessity.  HN/NUANCE  D:06/05/2020 T:06/05/2020 JOB:[id number]

## 2020-06-05 NOTE — Anesthesia Preprocedure Evaluation (Addendum)
Anesthesia Evaluation  Patient identified by MRN, date of birth, ID band Patient awake    Reviewed: Allergy & Precautions, H&P , NPO status , Patient's Chart, lab work & pertinent test results  Airway Mallampati: II  TM Distance: >3 FB Neck ROM: Full    Dental no notable dental hx. (+) Teeth Intact, Dental Advisory Given   Pulmonary neg pulmonary ROS,    Pulmonary exam normal breath sounds clear to auscultation       Cardiovascular hypertension, Pt. on medications  Rhythm:Regular Rate:Normal     Neuro/Psych CVA negative psych ROS   GI/Hepatic Neg liver ROS, GERD  Medicated,  Endo/Other  negative endocrine ROS  Renal/GU negative Renal ROS  negative genitourinary   Musculoskeletal   Abdominal   Peds  Hematology negative hematology ROS (+)   Anesthesia Other Findings   Reproductive/Obstetrics negative OB ROS                            Anesthesia Physical Anesthesia Plan  ASA: II  Anesthesia Plan: General   Post-op Pain Management:  Regional for Post-op pain   Induction: Intravenous  PONV Risk Score and Plan: 3 and Ondansetron, Dexamethasone and Midazolam  Airway Management Planned: LMA  Additional Equipment:   Intra-op Plan:   Post-operative Plan: Extubation in OR  Informed Consent: I have reviewed the patients History and Physical, chart, labs and discussed the procedure including the risks, benefits and alternatives for the proposed anesthesia with the patient or authorized representative who has indicated his/her understanding and acceptance.     Dental advisory given  Plan Discussed with: CRNA  Anesthesia Plan Comments:         Anesthesia Quick Evaluation

## 2020-06-06 ENCOUNTER — Ambulatory Visit: Payer: No Typology Code available for payment source | Admitting: Orthopedic Surgery

## 2020-06-06 ENCOUNTER — Encounter (HOSPITAL_BASED_OUTPATIENT_CLINIC_OR_DEPARTMENT_OTHER): Payer: Self-pay | Admitting: Orthopedic Surgery

## 2020-06-07 ENCOUNTER — Telehealth: Payer: Self-pay | Admitting: Orthopedic Surgery

## 2020-06-07 NOTE — Telephone Encounter (Signed)
Records faxed to [facility] @  [place] [phone] ph [phone]

## 2020-06-13 ENCOUNTER — Ambulatory Visit (INDEPENDENT_AMBULATORY_CARE_PROVIDER_SITE_OTHER): Payer: No Typology Code available for payment source | Admitting: Orthopedic Surgery

## 2020-06-13 ENCOUNTER — Ambulatory Visit (INDEPENDENT_AMBULATORY_CARE_PROVIDER_SITE_OTHER): Payer: No Typology Code available for payment source

## 2020-06-13 DIAGNOSIS — S82892A Other fracture of left lower leg, initial encounter for closed fracture: Secondary | ICD-10-CM

## 2020-06-13 MED ORDER — OXYCODONE-ACETAMINOPHEN 5-325 MG PO TABS: 1.0000 | ORAL_TABLET | Freq: Three times a day (TID) | ORAL | 0 refills | Status: DC | PRN

## 2020-06-15 ENCOUNTER — Encounter: Payer: Self-pay | Admitting: Orthopedic Surgery

## 2020-06-15 NOTE — Progress Notes (Signed)
   Post-Op Visit Note   Patient: Garrett Thompson.           Date of Birth: [birth year]           MRN: [id number] Visit Date: 06/13/2020 [facility]   Assessment & Plan:  Chief Complaint:  Chief Complaint  Patient presents with  . Left Ankle - Routine Post Op   Visit Diagnoses:  1. Closed fracture of left ankle, initial encounter     Plan: Patient presents now a week out left ankle ORIF of lateral malleolar fracture.  Patient's been doing well.  Splint is removed.  Incision intact.  No medial sided tenderness.  Negative Homans no calf tenderness.  Radiographs look good.  Plan is to refill Percocet.  Change over to fracture boot but okay to come out of the fracture boot for ankle dorsiflexion plantarflexion exercises.  See him back in 7 days for suture removal.  Continue with aspirin for DVT prophylaxis  Follow-Up Instructions: No follow-ups on file.   Orders:  Orders Placed This Encounter  Procedures  . XR Ankle Complete Left   Meds ordered this encounter  Medications  . oxyCODONE-acetaminophen (PERCOCET) 5-325 MG tablet    Sig: Take 1 tablet by mouth every 8 (eight) hours as needed for severe pain.    Dispense:  30 tablet    Refill:  0    Imaging: No results found.  PMFS History: There are no problems to display for this patient.  Past Medical History:  Diagnosis Date  . GERD (gastroesophageal reflux disease)   . Hypertension   . Stroke [facility])    at age 67    History reviewed. No pertinent family history.  Past Surgical History:  Procedure Laterality Date  . neck fusion    . ORIF ANKLE FRACTURE Left 06/05/2020   Procedure: LEFT OPEN REDUCTION INTERNAL FIXATION (ORIF) ANKLE FRACTURE;  Surgeon: Cammy Copa, MD;  Location: [facility];  Service: Orthopedics;  Laterality: Left;  . SPINAL FUSION  2009   Social History   Occupational History  . Not on file  Tobacco Use  . Smoking status: Never Smoker  . Smokeless tobacco:  Never Used  Substance and Sexual Activity  . Alcohol use: No  . Drug use: No  . Sexual activity: Not on file

## 2020-06-18 ENCOUNTER — Telehealth: Payer: Self-pay | Admitting: Orthopedic Surgery

## 2020-06-18 NOTE — Telephone Encounter (Signed)
06/13/20 ov note faxed Constance Haw, AttnBettey Mare [phone]

## 2020-06-20 ENCOUNTER — Encounter: Payer: Self-pay | Admitting: Surgical

## 2020-06-20 ENCOUNTER — Ambulatory Visit (INDEPENDENT_AMBULATORY_CARE_PROVIDER_SITE_OTHER): Payer: No Typology Code available for payment source | Admitting: Surgical

## 2020-06-20 DIAGNOSIS — S82892A Other fracture of left lower leg, initial encounter for closed fracture: Secondary | ICD-10-CM

## 2020-06-20 NOTE — Progress Notes (Signed)
   Post-Op Visit Note   Patient: Garrett Thompson.           Date of Birth: 07-21-1975           MRN: [id number] Visit Date: 06/20/2020 [facility]: [facility]   Assessment & Plan:  Chief Complaint:  Chief Complaint  Patient presents with  . Left Ankle - Routine Post Op   Visit Diagnoses:  1. Closed fracture of left ankle, initial encounter     Plan: Patient is a 45 year old male who presents s/p left ankle ORIF on 06/05/2020.  He is about 2 weeks out from procedure.  Doing well in regards to pain control.  He is here for suture removal today.  Incision is healing well with no evidence of infection or dehiscence.  Sutures removed and replaced with Steri-Strips.  Patient has no calf tenderness and negative Homans' sign on exam.  He dorsiflexes to within 5 degrees of neutral with the knee bent.  Pedal pulses are palpable.  Continue taking aspirin for DVT prophylaxis.  Plan to continue nonweightbearing at this time.  Follow-up in 2 weeks for clinical recheck with Dr. August Saucer and for x-ray recheck.  Patient understands that he may be nonweightbearing for 6 weeks total though there is a possibility that he could start some partial weightbearing in a fracture boot at the next appointment if there is significant callus formation noted on radiographs.  Follow-Up Instructions: No follow-ups on file.   Orders:  No orders of the defined types were placed in this encounter.  No orders of the defined types were placed in this encounter.   Imaging: No results found.  PMFS History: There are no problems to display for this patient.  Past Medical History:  Diagnosis Date  . GERD (gastroesophageal reflux disease)   . Hypertension   . Stroke [facility])    at age 5    No family history on file.  Past Surgical History:  Procedure Laterality Date  . neck fusion    . ORIF ANKLE FRACTURE Left 06/05/2020   Procedure: LEFT OPEN REDUCTION INTERNAL FIXATION (ORIF) ANKLE FRACTURE;  Surgeon: Cammy Copa, MD;  Location: [facility];  Service: Orthopedics;  Laterality: Left;  . SPINAL FUSION  2009   Social History   Occupational History  . Not on file  Tobacco Use  . Smoking status: Never Smoker  . Smokeless tobacco: Never Used  Substance and Sexual Activity  . Alcohol use: No  . Drug use: No  . Sexual activity: Not on file

## 2020-07-04 ENCOUNTER — Other Ambulatory Visit: Payer: Self-pay

## 2020-07-04 ENCOUNTER — Ambulatory Visit (INDEPENDENT_AMBULATORY_CARE_PROVIDER_SITE_OTHER): Payer: No Typology Code available for payment source | Admitting: Orthopedic Surgery

## 2020-07-04 ENCOUNTER — Ambulatory Visit (INDEPENDENT_AMBULATORY_CARE_PROVIDER_SITE_OTHER): Payer: No Typology Code available for payment source

## 2020-07-04 DIAGNOSIS — S82892A Other fracture of left lower leg, initial encounter for closed fracture: Secondary | ICD-10-CM

## 2020-07-08 ENCOUNTER — Encounter: Payer: Self-pay | Admitting: Orthopedic Surgery

## 2020-07-08 NOTE — Progress Notes (Signed)
   Post-Op Visit Note   Patient: Garrett Thompson.           Date of Birth: Nov 30, 1975           MRN: [id number] Visit Date: 07/04/2020 [facility]   Assessment & Plan:  Chief Complaint:  Chief Complaint  Patient presents with  . Left Ankle - Routine Post Op   Visit Diagnoses:  1. Closed fracture of left ankle, initial encounter     Plan: Garrett Thompson is a 45 year old patient who underwent left ankle open reduction internal fixation 06/05/2020.  He has been doing well.  On exam incision looks good.  Has 1 small area just over the malleolus which has about 1 mm of widening over a 1 cm area.  No fluctuance or drainage.  I would let him be weightbearing as tolerated in the fracture boot and Monday get a regular shoes.  His range of motion is good with about 20 degrees of dorsiflexion and 30 of plantarflexion.  Follow-up in 2 weeks for recheck on the incision.  Follow-Up Instructions: Return in about 2 weeks (around 07/18/2020).   Orders:  Orders Placed This Encounter  Procedures  . XR Ankle Complete Left   No orders of the defined types were placed in this encounter.   Imaging: No results found.  PMFS History: There are no problems to display for this patient.  Past Medical History:  Diagnosis Date  . GERD (gastroesophageal reflux disease)   . Hypertension   . Stroke [facility])    at age 60    No family history on file.  Past Surgical History:  Procedure Laterality Date  . neck fusion    . ORIF ANKLE FRACTURE Left 06/05/2020   Procedure: LEFT OPEN REDUCTION INTERNAL FIXATION (ORIF) ANKLE FRACTURE;  Surgeon: Cammy Copa, MD;  Location: [facility];  Service: Orthopedics;  Laterality: Left;  . SPINAL FUSION  2009   Social History   Occupational History  . Not on file  Tobacco Use  . Smoking status: Never Smoker  . Smokeless tobacco: Never Used  Substance and Sexual Activity  . Alcohol use: No  . Drug use: No  . Sexual activity: Not on  file

## 2020-07-18 ENCOUNTER — Ambulatory Visit (INDEPENDENT_AMBULATORY_CARE_PROVIDER_SITE_OTHER): Payer: No Typology Code available for payment source | Admitting: Orthopedic Surgery

## 2020-07-18 ENCOUNTER — Other Ambulatory Visit: Payer: Self-pay

## 2020-07-18 DIAGNOSIS — S82892A Other fracture of left lower leg, initial encounter for closed fracture: Secondary | ICD-10-CM

## 2020-07-19 ENCOUNTER — Encounter: Payer: Self-pay | Admitting: Orthopedic Surgery

## 2020-07-19 NOTE — Progress Notes (Signed)
   Post-Op Visit Note   Patient: Garrett Thompson.           Date of Birth: Feb 02, 1976           MRN: [id number] Visit Date: 07/18/2020 [facility]: [facility]   Assessment & Plan:  Chief Complaint:  Chief Complaint  Patient presents with  . Left Ankle - Follow-up   Visit Diagnoses:  1. Closed fracture of left ankle, initial encounter     Plan: Patient is a 45 year old male presents s/p left ankle ORIF on 06/05/2020.  He is here for 2-week follow-up visit.  He is currently partial weightbearing with crutches and complains of moderate pain with weightbearing.  He notes feeling a catching sensation to the medial ankle.  He is trying home exercise program for his ankle range of motion.  Incision is healing well with some small area of granulation in the in the midportion of the lateral incision.  There is no expressible drainage or any warmth.  The breakdown is fairly superficial.  Syndesmosis is stable on exam.  His motion is somewhat restricted and seems slightly worse than at his last visit.  Plan to refer patient to physical therapy for left ankle passive and active range of motion and gait training at the [place].  Follow-up in 2 weeks for clinical recheck on his range of motion and incision.  Follow-Up Instructions: No follow-ups on file.   Orders:  No orders of the defined types were placed in this encounter.  No orders of the defined types were placed in this encounter.   Imaging: No results found.  PMFS History: There are no problems to display for this patient.  Past Medical History:  Diagnosis Date  . GERD (gastroesophageal reflux disease)   . Hypertension   . Stroke [facility])    at age 58    No family history on file.  Past Surgical History:  Procedure Laterality Date  . neck fusion    . ORIF ANKLE FRACTURE Left 06/05/2020   Procedure: LEFT OPEN REDUCTION INTERNAL FIXATION (ORIF) ANKLE FRACTURE;  Surgeon: Cammy Copa, MD;  Location: [facility];  Service: Orthopedics;  Laterality: Left;  . SPINAL FUSION  2009   Social History   Occupational History  . Not on file  Tobacco Use  . Smoking status: Never Smoker  . Smokeless tobacco: Never Used  Substance and Sexual Activity  . Alcohol use: No  . Drug use: No  . Sexual activity: Not on file

## 2020-08-01 ENCOUNTER — Other Ambulatory Visit: Payer: Self-pay

## 2020-08-01 ENCOUNTER — Ambulatory Visit (INDEPENDENT_AMBULATORY_CARE_PROVIDER_SITE_OTHER): Payer: No Typology Code available for payment source | Admitting: Orthopedic Surgery

## 2020-08-01 ENCOUNTER — Encounter: Payer: Self-pay | Admitting: Orthopedic Surgery

## 2020-08-01 DIAGNOSIS — S82892A Other fracture of left lower leg, initial encounter for closed fracture: Secondary | ICD-10-CM

## 2020-08-01 NOTE — Progress Notes (Signed)
   Post-Op Visit Note   Patient: Garrett Thompson.           Date of Birth: 01/20/1976           MRN: [id number] Visit Date: 08/01/2020 [facility]   Assessment & Plan:  Chief Complaint:  Chief Complaint  Patient presents with  . Left Ankle - Routine Post Op   Visit Diagnoses:  1. Closed fracture of left ankle, initial encounter     Plan: Patient presents now about 2 months out left lateral malleolus fracture open reduction internal fixation.  He is using a crutch ambulating full weightbearing.  Still is having some pain on the medial aspect of the ankle.  Has MRI scan of the knee on 08/12/2020.  Feels a poking sensation medially.  Hurts after 10 minutes of standing.  On examination the incision is well-healed with no fluctuance or problems distally around the incision.  Does have a 1 cm area which is slowly healing in from the inside out but there is no issues and it is progressing nicely with near full epithelialization at this time.  Syndesmosis is stable on exam.  Has slightly less than the expected amount of swelling.  Did have very tight heel cord last clinic visit and that has improved.  I think some of this medial sided tenderness may be related to his tight heel cord.  Posterior tib tendon is palpable and intact and functional.  Does have generally flat feet bilaterally.  Plan at this time is transition from crutch to cane.  Prescription provided.  Negative Homans no calf tenderness today.  4-week return for clinical recheck.  Continue with therapy.  Follow-Up Instructions: No follow-ups on file.   Orders:  No orders of the defined types were placed in this encounter.  No orders of the defined types were placed in this encounter.   Imaging: No results found.  PMFS History: There are no problems to display for this patient.  Past Medical History:  Diagnosis Date  . GERD (gastroesophageal reflux disease)   . Hypertension   . Stroke [facility])    at age 22     History reviewed. No pertinent family history.  Past Surgical History:  Procedure Laterality Date  . neck fusion    . ORIF ANKLE FRACTURE Left 06/05/2020   Procedure: LEFT OPEN REDUCTION INTERNAL FIXATION (ORIF) ANKLE FRACTURE;  Surgeon: Cammy Copa, MD;  Location: [facility];  Service: Orthopedics;  Laterality: Left;  . SPINAL FUSION  2009   Social History   Occupational History  . Not on file  Tobacco Use  . Smoking status: Never Smoker  . Smokeless tobacco: Never Used  Substance and Sexual Activity  . Alcohol use: No  . Drug use: No  . Sexual activity: Not on file

## 2020-08-29 ENCOUNTER — Ambulatory Visit: Payer: Self-pay

## 2020-08-29 ENCOUNTER — Ambulatory Visit (INDEPENDENT_AMBULATORY_CARE_PROVIDER_SITE_OTHER): Payer: No Typology Code available for payment source | Admitting: Orthopedic Surgery

## 2020-08-29 DIAGNOSIS — S82892A Other fracture of left lower leg, initial encounter for closed fracture: Secondary | ICD-10-CM

## 2020-09-02 ENCOUNTER — Encounter: Payer: Self-pay | Admitting: Orthopedic Surgery

## 2020-09-02 NOTE — Progress Notes (Signed)
   Post-Op Visit Note   Patient: Garrett Thompson.           Date of Birth: [birth year]           MRN: [id number] Visit Date: 08/29/2020 [facility]   Assessment & Plan:  Chief Complaint:  Chief Complaint  Patient presents with  . Left Ankle - Follow-up   Visit Diagnoses:  1. Closed fracture of left ankle, initial encounter     Plan: Patient presents for follow-up left ankle ORIF 06/05/2020.  Reports some swelling primarily on the medial aspect of his ankle.  He has been having some trouble with pain as well as getting his motion back.  On examination the lateral incision is well-healed.  He does have medial sided swelling.  Has about 10 degrees of dorsiflexion and 15 of plantarflexion which is about half of his right ankle.  He also has very limited posterior tib tendon function.  The anterior tib and peroneal function is better compared to the posterior tib function.  Radiographs are unremarkable and show healing of the fracture.  Plan at this time is MRI scan of that left ankle to evaluate the posterior tib tendon.  Occult talar dome lesion is also possible.  No calf tenderness negative Homans today.  Continue with range of motion exercises on the left ankle.  His feet are fairly flat so assessing heel inversion when he standing on his toes is difficult and not necessarily diagnostic at this time  Follow-Up Instructions: No follow-ups on file.   Orders:  Orders Placed This Encounter  Procedures  . XR Ankle Complete Left  . MR Ankle Left w/o contrast   No orders of the defined types were placed in this encounter.   Imaging: No results found.  PMFS History: There are no problems to display for this patient.  Past Medical History:  Diagnosis Date  . GERD (gastroesophageal reflux disease)   . Hypertension   . Stroke [facility])    at age 49    History reviewed. No pertinent family history.  Past Surgical History:  Procedure Laterality Date  . neck fusion     . ORIF ANKLE FRACTURE Left 06/05/2020   Procedure: LEFT OPEN REDUCTION INTERNAL FIXATION (ORIF) ANKLE FRACTURE;  Surgeon: Cammy Copa, MD;  Location: [facility];  Service: Orthopedics;  Laterality: Left;  . SPINAL FUSION  2009   Social History   Occupational History  . Not on file  Tobacco Use  . Smoking status: Never Smoker  . Smokeless tobacco: Never Used  Substance and Sexual Activity  . Alcohol use: No  . Drug use: No  . Sexual activity: Not on file

## 2020-09-09 ENCOUNTER — Ambulatory Visit
Admission: RE | Admit: 2020-09-09 | Discharge: 2020-09-09 | Disposition: A | Discharge status: Home / Self Care | Payer: No Typology Code available for payment source | Source: Ambulatory Visit | Attending: Orthopedic Surgery | Admitting: Orthopedic Surgery

## 2020-09-09 DIAGNOSIS — S82892A Other fracture of left lower leg, initial encounter for closed fracture: Secondary | ICD-10-CM

## 2020-09-17 ENCOUNTER — Encounter: Payer: Self-pay | Admitting: Orthopedic Surgery

## 2020-09-17 NOTE — Telephone Encounter (Signed)
I called and discussed with him his MRI scan.  MRI scan shows no real medial sided problems in terms of medial malleolar stress fracture or talar dome lesion or posterior tib tendon problem.  All in all I think we need to wait this out and have him continue to work on range of motion of the ankle.  Can you cancel his appointment for next week and then have him come back in 3 weeks so we can recheck his motion.  Thanks

## 2020-10-10 ENCOUNTER — Ambulatory Visit (INDEPENDENT_AMBULATORY_CARE_PROVIDER_SITE_OTHER): Payer: No Typology Code available for payment source | Admitting: Orthopedic Surgery

## 2020-10-10 DIAGNOSIS — R200 Anesthesia of skin: Secondary | ICD-10-CM

## 2020-10-10 DIAGNOSIS — R208 Other disturbances of skin sensation: Secondary | ICD-10-CM

## 2020-10-10 MED ORDER — GABAPENTIN 300 MG PO CAPS: 300.0000 mg | ORAL_CAPSULE | Freq: Three times a day (TID) | ORAL | 0 refills | Status: DC

## 2020-10-11 ENCOUNTER — Telehealth: Payer: Self-pay

## 2020-10-11 NOTE — Telephone Encounter (Signed)
Can you please call and get patient set up to see Dr Lajoyce Corners for left ankle thanks.

## 2020-10-12 ENCOUNTER — Encounter: Payer: Self-pay | Admitting: Orthopedic Surgery

## 2020-10-12 NOTE — Progress Notes (Signed)
Post-Op Visit Note   Patient: Garrett Thompson.           Date of Birth: 1976/04/30           MRN: [id number] Visit Date: 10/10/2020 [facility]   Assessment & Plan:  Chief Complaint:  Chief Complaint  Patient presents with  . Left Ankle - Follow-up   Visit Diagnoses:  1. Numbness of left foot     Plan:.  The patient underwent left ankle ORIF of lateral malleolar fracture.  He also had injury to the posterior tibial plafond which was nondisplaced.  Underwent open reduction internal fixation and has been having some pain since then.  He also reports decreased plantar sensation.  He is using a cane.  He has retired.  Hard for him to walk barefoot.  In general he has been in therapy to work on range of motion of the ankle as well.  Has had an MRI scan which does not show any medial sided problem with the posterior tib tendon.  No hardware complications.  On examination he does have diminished plantar sensation on that left foot.  Ankle range of motion also diminished on the left compared to the right but it is difficult to get a completely relaxed exam on this patient.  Pedal pulses intact.  No skin color changes or temperature difference left foot versus right foot.  Incision is well-healed.  Syndesmosis feel stable on exam.  No calf tenderness negative Homans.  Impression is failure to recover following left ankle open reduction internal fixation.  I Minna refill his gabapentin.  Needs nerve conduction left lower extremity to evaluate that plantar numbness.  Not present on the right-hand side.  Question whether or not this may be RSD.  I would like for him to see Dr. Lajoyce Corners for evaluation of the foot for the stiffness pain and medial sided issues he is having.  Not clear exactly what is going on with the foot at this time particularly with the plantar numbness.  He did have a fracture which required reduction.  Tibial nerve could have been stretched at that  time.  Follow-Up Instructions: No follow-ups on file.   Orders:  Orders Placed This Encounter  Procedures  . Ambulatory referral to Neurology   Meds ordered this encounter  Medications  . gabapentin (NEURONTIN) 300 MG capsule    Sig: Take 1 capsule (300 mg total) by mouth 3 (three) times daily.    Dispense:  90 capsule    Refill:  0    Imaging: No results found.  PMFS History: There are no problems to display for this patient.  Past Medical History:  Diagnosis Date  . GERD (gastroesophageal reflux disease)   . Hypertension   . Stroke [facility])    at age 25    History reviewed. No pertinent family history.  Past Surgical History:  Procedure Laterality Date  . neck fusion    . ORIF ANKLE FRACTURE Left 06/05/2020   Procedure: LEFT OPEN REDUCTION INTERNAL FIXATION (ORIF) ANKLE FRACTURE;  Surgeon: Cammy Copa, MD;  Location: [facility];  Service: Orthopedics;  Laterality: Left;  . SPINAL FUSION  2009   Social History   Occupational History  . Not on file  Tobacco Use  . Smoking status: Never Smoker  . Smokeless tobacco: Never Used  Substance and Sexual Activity  . Alcohol use: No  . Drug use: No  . Sexual activity: Not on file

## 2020-10-16 ENCOUNTER — Encounter: Payer: Self-pay | Admitting: Neurology

## 2020-11-26 ENCOUNTER — Other Ambulatory Visit: Payer: Self-pay

## 2020-11-26 DIAGNOSIS — R202 Paresthesia of skin: Secondary | ICD-10-CM

## 2020-11-27 ENCOUNTER — Ambulatory Visit (INDEPENDENT_AMBULATORY_CARE_PROVIDER_SITE_OTHER): Payer: No Typology Code available for payment source | Admitting: Neurology

## 2020-11-27 ENCOUNTER — Other Ambulatory Visit: Payer: Self-pay

## 2020-11-27 DIAGNOSIS — R202 Paresthesia of skin: Secondary | ICD-10-CM

## 2020-11-27 NOTE — Progress Notes (Signed)
Patient arrived for NCS/EMG of the left leg, however he was unable to tolerate testing, so it was terminated at patient's request.

## 2020-12-25 ENCOUNTER — Ambulatory Visit (INDEPENDENT_AMBULATORY_CARE_PROVIDER_SITE_OTHER): Payer: No Typology Code available for payment source | Admitting: Neurology

## 2020-12-25 ENCOUNTER — Encounter: Payer: Self-pay | Admitting: Neurology

## 2020-12-25 ENCOUNTER — Telehealth: Payer: Self-pay | Admitting: Neurology

## 2020-12-25 VITALS — BP 148/100 | HR 79 | Ht 73.0 in | Wt 256.6 lb

## 2020-12-25 DIAGNOSIS — R29818 Other symptoms and signs involving the nervous system: Secondary | ICD-10-CM

## 2020-12-25 DIAGNOSIS — G4486 Cervicogenic headache: Secondary | ICD-10-CM | POA: Diagnosis not present

## 2020-12-25 DIAGNOSIS — I69354 Hemiplegia and hemiparesis following cerebral infarction affecting left non-dominant side: Secondary | ICD-10-CM

## 2020-12-25 DIAGNOSIS — H539 Unspecified visual disturbance: Secondary | ICD-10-CM

## 2020-12-25 DIAGNOSIS — M542 Cervicalgia: Secondary | ICD-10-CM | POA: Insufficient documentation

## 2020-12-25 DIAGNOSIS — G4719 Other hypersomnia: Secondary | ICD-10-CM

## 2020-12-25 DIAGNOSIS — R51 Headache with orthostatic component, not elsewhere classified: Secondary | ICD-10-CM

## 2020-12-25 DIAGNOSIS — R0683 Snoring: Secondary | ICD-10-CM

## 2020-12-25 DIAGNOSIS — I639 Cerebral infarction, unspecified: Secondary | ICD-10-CM

## 2020-12-25 DIAGNOSIS — M5481 Occipital neuralgia: Secondary | ICD-10-CM | POA: Insufficient documentation

## 2020-12-25 MED ORDER — ALPRAZOLAM 0.25 MG PO TABS: ORAL_TABLET | ORAL | 0 refills | Status: DC

## 2020-12-25 NOTE — Patient Instructions (Addendum)
MRI of the brain w/wo contrast Refer to Dr. Jordan Garrett Thompson - can see if he can give other options for nerve blocks Recommend "Occipital Nerve Blocks" in the meantime - please send me a mychart message Diagnosis: Occipital neuralgia/cervicalgia Sleep apnea evaluation: Dr. Vickey Hugerohmeier     Occipital Neuralgia  Occipital neuralgia is a type of headache that causes brief episodes of very bad pain in the back of the head. Pain from occipital neuralgia may spread (radiate) to other parts of the head. These headaches may be caused by irritation of the nerves that leave the spinal cord high up in the neck, just below the base of the skull (occipital nerves). The occipital nerves transmit sensations from the back of the head, the topof the head, and the areas behind the ears. What are the causes? This condition can occur without any known cause (primary headache syndrome). In other cases, this condition is caused by pressure on or irritation of one of the two occipital nerves. Pressure and irritation may be due to: Muscle spasm in the neck. Neck injury. Wear and tear of the vertebrae in the neck (osteoarthritis). Disease of the disks that separate the vertebrae. Swollen blood vessels that put pressure on the occipital nerves. Infections. Tumors. Diabetes. What are the signs or symptoms? This condition causes brief burning, stabbing, electric, shocking, or shooting pain in the back of the head that can radiate to the top of the head. It can happen on one side or both sides of the head. It can also cause: Pain behind the eye. Pain triggered by neck movement or hair brushing. Scalp tenderness. Aching in the back of the head between episodes of very bad pain. Pain that gets worse with exposure to bright lights. How is this diagnosed? Your health care provider may diagnose the condition based on a physical exam and your symptoms. Tests may be done, such as: Imaging studies of the brain and neck (cervical  spine), such as an MRI or CT scan. These look for causes of pinched nerves. Applying pressure to the nerves in the neck to try to re-create the pain. Injection of numbing medicine into the occipital nerve areas to see if pain goes away (diagnostic nerve block). How is this treated? Treatment for this condition may begin with simple measures, such as: Rest. Massage. Applying heat or cold to the area. Over-the-counter pain relievers. If these measures do not work, you may need other treatments, including: Medicines, such as: Prescription-strength anti-inflammatory medicines. Muscle relaxants. Anti-seizure medicines, which can relieve pain. Antidepressants, which can relieve pain. Injected medicines, such as medicines that numb the area (local anesthetic) and steroids. Pulsed radiofrequency ablation. This is when wires are implanted to deliver electrical impulses that block pain signals from the occipital nerve. Surgery to relieve nerve pressure. Physical therapy. Follow these instructions at home: Managing pain     Avoid any activities that cause pain. Rest when you have an attack of pain. Try gentle massage to relieve pain. Try a different pillow or sleeping position. If directed, apply heat to the affected area as often as told by your health care provider. Use the heat source that your health care provider recommends, such as a moist heat pack or a heating pad. Place a towel between your skin and the heat source. Leave the heat on for 20-30 minutes. Remove the heat if your skin turns bright red. This is especially important if you are unable to feel pain, heat, or cold. You have a greater risk of  getting burned. If directed, put ice on the back of your head and neck area. To do this: Put ice in a plastic bag. Place a towel between your skin and the bag. Leave the ice on for 20 minutes, 2-3 times a day. Remove the ice if your skin turns bright red. This is very important. If you  cannot feel pain, heat, or cold, you have a greater risk of damage to the area. General instructions Take over-the-counter and prescription medicines only as told by your health care provider. Avoid things that make your symptoms worse, such as bright lights. Try to stay active. Get regular exercise that does not cause pain. Ask your health care provider to suggest safe exercises for you. Work with a physical therapist to learn stretching exercises you can do at home. Practice good posture. Keep all follow-up visits. This is important. Contact a health care provider if: Your medicine is not working. You have new or worsening symptoms. Get help right away if: You have very bad head pain that does not go away. You have a sudden change in vision, balance, or speech. These symptoms may represent a serious problem that is an emergency. Do not wait to see if the symptoms will go away. Get medical help right away. Call your local emergency services (911 in the [place] Do not drive yourself to the hospital. Summary Occipital neuralgia is a type of headache that causes brief episodes of very bad pain in the back of the head. Pain from occipital neuralgia may spread (radiate) to other parts of the head. Treatment for this condition includes rest, massage, and medicines. This information is not intended to replace advice given to you by your health care provider. Make sure you discuss any questions you have with your healthcare provider. Document Revised: 02/26/2020 Document Reviewed: 02/26/2020 Elsevier Patient Education  2022 Else[employer] Inc.  Sleep Apnea Sleep apnea is a condition in which breathing pauses or becomes shallow during sleep. People with sleep apnea usually snore loudly. They may have times when they gasp and stop breathing for 10 seconds or more during sleep. This mayhappen many times during the night. Sleep apnea disrupts your sleep and keeps your body from getting the rest that it  needs. This condition can increase your risk of certain health problems, including: Heart attack. Stroke. Obesity. Type 2 diabetes. Heart failure. Irregular heartbeat. High blood pressure. The goal of treatment is to help you breathe normally again. What are the causes? The most common cause of sleep apnea is a collapsed or blocked airway. There are three kinds of sleep apnea: Obstructive sleep apnea. This kind is caused by a blocked or collapsed airway. Central sleep apnea. This kind happens when the part of the brain that controls breathing does not send the correct signals to the muscles that control breathing. Mixed sleep apnea. This is a combination of obstructive and central sleep apnea. What increases the risk? You are more likely to develop this condition if you: Are overweight. Smoke. Have a smaller than normal airway. Are older. Are male. Drink alcohol. Take sedatives or tranquilizers. Have a family history of sleep apnea. Have a tongue or tonsils that are larger than normal. What are the signs or symptoms? Symptoms of this condition include: Trouble staying asleep. Loud snoring. Morning headaches. Waking up gasping. Dry mouth or sore throat in the morning. Daytime sleepiness and tiredness. If you have daytime fatigue because of sleep apnea, you may be more likely to have: Trouble concentrating. Forgetfulness.  Irritability or mood swings. Personality changes. Feelings of depression. Sexual dysfunction. This may include loss of interest if you are male, or erectile dysfunction if you are male. How is this diagnosed? This condition may be diagnosed with: A medical history. A physical exam. A series of tests that are done while you are sleeping (sleep study). These tests are usually done in a sleep lab, but they may also be done at home. How is this treated? Treatment for this condition aims to restore normal breathing and to ease symptoms during sleep. It may  involve managing health issues that can affect breathing, such as high blood pressure or obesity. Treatment may include: Sleeping on your side. Using a decongestant if you have nasal congestion. Avoiding the use of depressants, including alcohol, sedatives, and narcotics. Losing weight if you are overweight. Making changes to your diet. Quitting smoking. Using a device to open your airway while you sleep, such as: An oral appliance. This is a custom-made mouthpiece that shifts your lower jaw forward. A continuous positive airway pressure (CPAP) device. This device blows air through a mask when you breathe out (exhale). A nasal expiratory positive airway pressure (EPAP) device. This device has valves that you put into each nostril. A bi-level positive airway pressure (BPAP) device. This device blows air through a mask when you breathe in (inhale) and breathe out (exhale). Having surgery if other treatments do not work. During surgery, excess tissue is removed to create a wider airway. Follow these instructions at home: Lifestyle Make any lifestyle changes that your health care provider recommends. Eat a healthy, well-balanced diet. Take steps to lose weight if you are overweight. Avoid using depressants, including alcohol, sedatives, and narcotics. Do not use any products that contain nicotine or tobacco. These products include cigarettes, chewing tobacco, and vaping devices, such as e-cigarettes. If you need help quitting, ask your health care provider. General instructions Take over-the-counter and prescription medicines only as told by your health care provider. If you were given a device to open your airway while you sleep, use it only as told by your health care provider. If you are having surgery, make sure to tell your health care provider you have sleep apnea. You may need to bring your device with you. Keep all follow-up visits. This is important. Contact a health care provider  if: The device that you received to open your airway during sleep is uncomfortable or does not seem to be working. Your symptoms do not improve. Your symptoms get worse. Get help right away if: You develop: Chest pain. Shortness of breath. Discomfort in your back, arms, or stomach. You have: Trouble speaking. Weakness on one side of your body. Drooping in your face. These symptoms may represent a serious problem that is an emergency. Do not wait to see if the symptoms will go away. Get medical help right away. Call your local emergency services (911 in the [place] Do not drive yourself to the hospital. Summary Sleep apnea is a condition in which breathing pauses or becomes shallow during sleep. The most common cause is a collapsed or blocked airway. The goal of treatment is to restore normal breathing and to ease symptoms during sleep. This information is not intended to replace advice given to you by your health care provider. Make sure you discuss any questions you have with your healthcare provider. Document Revised: 04/06/2020 Document Reviewed: 04/06/2020 Elsevier Patient Education  2022 Elsevier Inc.  Occipital Nerve Block Patient Information  Description: The occipital nerves  originate in the cervical (neck) spinal cord and travel upward through muscle and tissue to supply sensation to the back of the head and top of the scalp.  In addition, the nerves control some of the muscles of the scalp.  Occipital neuralgia is an irritation of these nerves which can cause headaches, numbness of the scalp, and neck discomfort.     The occipital nerve block will interrupt nerve transmission through these nerves and can relieve pain and spasm.  The block consists of insertion of a small needle under the skin in the back of the head to deposit local anesthetic (numbing medicine) and/or steroids around the nerve.  The entire block usually lasts less than 5 minutes.  Conditions which may be  treated by occipital blocks:  Muscular pain and spasm of the scalp Nerve irritation, back of the head Headaches Upper neck pain   Possible side-effects:  Bleeding from needle site Infection (rare, may require surgery) Nerve injury (rare) Hair on back of neck can be tinged with iodine scrub (this will wash out) Light-headedness (temporary) Pain at injection site (several days) Decreased blood pressure (rare, temporary) Seizure (very rare)  Call if you experience:  Hives or difficulty breathing ( go to the emergency room) Inflammation or drainage at the injection site(s)  Please note:  Although the local anesthetic injected can often make your painful muscles or headache feel good for several hours after the injection, the pain may return.  It takes 3-7 days for steroids to work.  You may not notice any pain relief for at least one week.  If effective, we will often do a series of injections spaced 3-6 weeks apart to maximally decrease your pain.  If you have any questions, please call [phone] [facility]

## 2020-12-25 NOTE — Progress Notes (Signed)
[facility]    Provider:  Dr Lucia GaskinsAhern Requesting Provider: Jola BabinskiBlaykock, Heidi Y, FNP [facility]  [facility]  CC:  neck pain and shooting pain up the back of the head  HPI:  Garrett L Charmayne SheerRogers Jr. is a 45 y.o. male here as requested by Jola BabinskiBlaykock, Heidi Y, FNP for neck pain and headaches. The headaches start in the back of the head and radiate to the top of the head. Lightning bolts in his head. Throbbing in between. He has having physical therapy, he says he has stroke but I have no records. He had a CT scan and he was told her had a stroke but never had an MRI of the brain. He has uncontrolled high blood pressure. He says he had an episode where he had an odd episode in March. His wife also provides much information. The pain is shooting up the back of the neck. He has also thought about a spinal stimulator. Tried ESI injections but not medial branch blocks. Not migrainous, no migraine symptoms. Triptans did not help. Tried Gabapentin. Headaches are daily, shooting with throbbing in between. Also lots of neck pain. He is doing physical therapy. Ongoing for over a year. Worsening. Also blurry vision. He also snores heavily, recommended sleep study as well, discussed. No hx of migraines. Very tired, snore heavily, Significant neck pain. Wife says he snores heavily, he falls asleep instantaneously.   Reviewed notes, labs and imaging from outside physicians, which showed:  I reviewed MRI of the cervical spine images and agree with the following: C1-C2 and craniocervical junction unremarkable.  C2-C3 moderate left uncovertebral osteophytosis and mild left facet arthrosis with mild left neural foraminal narrowing.  C3-C4 moderate bulging disc osteophyte complex moderate left uncovertebral osteophytosis mild left facet arthrosis mild spinal canal stenosis and minimal deformity of the central cord.  Left vertebral artery loop extending medially may contribute to compressive  radiculopathy upon exiting left C4 nerve roots.  C4-C5 moderate to severe broad central disc osteophyte complex with mild spinal canal stenosis and mild deformity of central cord.  Anterior discectomy and interbody fusion without spinal canal stenosis or neuroforaminal narrowing.  C6-C7 small central disc protrusion superimposed upon moderate bulging disc osteophyte complex with moderate to severe left and moderate right uncovertebral osteophytosis.  No spinal canal stenosis or deformity of the cord.  Mild-moderate left and mild right neural foraminal narrowing.  Mild bilateral uncovertebral osteophytosis more pronounced on the right without neuroforaminal narrowing.  Review of Systems: Patient complains of symptoms per HPI as well as the following symptoms snoring,fatigue. Pertinent negatives and positives per HPI. All others negative.   Social History   Socioeconomic History   Marital status: Married    Spouse name: Not on file   Number of children: Not on file   Years of education: Not on file   Highest education level: Not on file  Occupational History   Not on file  Tobacco Use   Smoking status: Never   Smokeless tobacco: Never  Vaping Use   Vaping Use: Never used  Substance and Sexual Activity   Alcohol use: No   Drug use: No   Sexual activity: Not on file  Other Topics Concern   Not on file  Social History Narrative   Not on file   Social Determinants of Health   Financial Resource Strain: Not on file  Food Insecurity: Not on file  Transportation Needs: Not on file  Physical Activity: Not on file  Stress:  Not on file  Social Connections: Not on file  Intimate Partner Violence: Not on file    Family History  Problem Relation Age of Onset   Migraines Neg Hx     Past Medical History:  Diagnosis Date   GERD (gastroesophageal reflux disease)    Hypertension    Stroke [facility])    at age 61    Patient Active Problem List   Diagnosis Date Noted   Cervicogenic  headache 12/25/2020   Bilateral occipital neuralgia 12/25/2020   Cervicalgia 12/25/2020     Past Surgical History:  Procedure Laterality Date   neck fusion     ORIF ANKLE FRACTURE Left 06/05/2020   Procedure: LEFT OPEN REDUCTION INTERNAL FIXATION (ORIF) ANKLE FRACTURE;  Surgeon: Cammy Copa, MD;  Location: [facility];  Service: Orthopedics;  Laterality: Left;   SPINAL FUSION  2009    Current Outpatient Medications  Medication Sig Dispense Refill   acetaminophen (TYLENOL) 325 MG tablet TAKE TWO TABLETS BY MOUTH 3 TIMES A DAY AS NEEDED FOR PAIN OR FEVER EACH TABLET CONTAINS 325MG  ACETAMINOPHEN (TYLENOL,APAP). MAXIMUM DAILY RECOMMENDED DOSE IS 3000MG      acetaminophen (TYLENOL) 500 MG tablet TAKE TWO TABLETS BY MOUTH THREE TIMES A DAY AS NEEDED FOR PAIN     albuterol (PROVENTIL HFA;VENTOLIN HFA) 108 (90 BASE) MCG/ACT inhaler Inhale 2 puffs into the lungs every 4 (four) hours as needed for wheezing or shortness of breath (or coughing). 1 Inhaler 0   ALPRAZolam (XANAX) 0.25 MG tablet Take 1-2 tabs (0.25mg -0.50mg ) 30-60 minutes before procedure. May repeat if needed.Do not drive. 4 tablet 0   cetirizine (ZYRTEC) 10 MG tablet Take 1 tablet by mouth daily.     cyclobenzaprine (FLEXERIL) 5 MG tablet Take 5 mg by mouth 3 (three) times daily as needed. For spasms.     fluticasone (FLONASE) 50 MCG/ACT nasal spray INSTILL 1 SPRAY IN EACH NOSTRIL TWICE A DAY     gabapentin (NEURONTIN) 300 MG capsule Take 1 capsule (300 mg total) by mouth 3 (three) times daily. 90 capsule 0   hydrochlorothiazide (HYDRODIURIL) 25 MG tablet Take 0.5 tablets by mouth daily.     lisinopril (PRINIVIL,ZESTRIL) 5 MG tablet Take 2.5 mg by mouth daily.     omeprazole (PRILOSEC) 10 MG capsule Take 20 mg by mouth daily.     oxyCODONE-acetaminophen (PERCOCET) 5-325 MG tablet Take 1 tablet by mouth every 8 (eight) hours as needed for severe pain. 30 tablet 0   SUMAtriptan (IMITREX) 100 MG tablet TAKE ONE TABLET  BY MOUTH AS DIRECTED (FIRST DOSE AT ONSET OF HEADACHE,THEN REPEAT AFTER 2 HOURS IF NO RELIEF-NO MORE THAN 200 MG PER DAY)     tiZANidine (ZANAFLEX) 2 MG tablet Take 2 mg by mouth 2 (two) times daily.     No current facility-administered medications for this visit.    Allergies as of 12/25/2020 - Review Complete 12/25/2020  Allergen Reaction Noted   Mobic [meloxicam]  05/10/2013   Mushroom extract complex  05/31/2020   Shellfish allergy  05/31/2020    Vitals: BP (!) 148/100   Pulse 79   Ht 6\' 1"  (1.854 m)   Wt 256 lb 9.6 oz (116.4 kg)   BMI 33.85 kg/m  Last Weight:  Wt Readings from Last 1 Encounters:  12/25/20 256 lb 9.6 oz (116.4 kg)   Last Height:   Ht Readings from Last 1 Encounters:  12/25/20 6\' 1"  (1.854 m)     Physical exam: Exam: Gen: NAD, conversant, well  nourised, obese, well groomed                     CV: RRR, no MRG. No Carotid Bruits. No peripheral edema, warm, nontender Eyes: Conjunctivae clear without exudates or hemorrhage  Neuro: Detailed Neurologic Exam  Speech:    Speech is normal; fluent and spontaneous with normal comprehension.  Cognition:    The patient is oriented to person, place, and time;     recent and remote memory intact;     language fluent;     normal attention, concentration,     fund of knowledge Cranial Nerves:    The pupils are equal, round, and reactive to light.  Pupils too small to visualize fundi.  Visual fields are full to finger confrontation. Extraocular movements are intact. Trigeminal sensation is intact and the muscles of mastication are normal. The face is symmetric. The palate elevates in the midline. Hearing intact. Voice is normal. Shoulder shrug is normal. The tongue has normal motion without fasciculations.   Coordination:    No dysmetria or ataxia  Gait:    Antalgic with a cane  Motor Observation:    Left anle surgery, decreased ROM Tone:    Normal muscle tone.    Posture:    Posture is normal. normal  erect    Strength: Strength is limited by pain but appears there may be some mild left hemiplegia however unclear.       Sensation: decreased left arm and leg     Reflex Exam:  DTR's: defer left ankle. Otherwise deep tendon reflexes in the upper and lower extremities are symmetrical bilaterally.   Toes:    The toes are downgoing bilaterally.   Clonus:    Clonus is absent.    Assessment/Plan: This is a 45 year old patient with likely cervicalgia and occipital neuralgia.  - MRI of the brain wo contrast(creatinine elevated, no contrast): For concerning symptoms of vision changes, intractable headache, reported his stroke on CT scan, left hemiparesis and left hemisensory loss with multiple vascular risk factors, vision changes -Refer to Dr. Jordan Likes - can see if he can give other options for nerve blocks, medial branch blocks or other; Cervicalgia and occipital neuralgia. Evaluate and treat including medial branch blocks or other procedures as appropriate - Recommend "Occipital Nerve Blocks" in the meantime - please send me a mychart message if you would like to schedule - Diagnosis: Occipital neuralgia/cervicalgia - Sleep apnea evaluation: Dr. Vickey Huger: Uncontrolled blood pressure, snores heavily, excessive daytime fatigue, stroke reported. - MRI of the brain: Need xanax   Orders Placed This Encounter  Procedures   MR BRAIN WO CONTRAST   Basic Metabolic Panel   Ambulatory referral to Sleep Studies   Ambulatory referral to Pain Clinic   Meds ordered this encounter  Medications   ALPRAZolam (XANAX) 0.25 MG tablet    Sig: Take 1-2 tabs (0.25mg -0.50mg ) 30-60 minutes before procedure. May repeat if needed.Do not drive.    Dispense:  4 tablet    Refill:  0    Cc: Blaykock, Orpah Greek, FNP,  Clinic, [facility] Blare, MD  [facility] [address] [place], [place] [address]  Phone [phone] Fax [phone]  I spent 110 minutes of  face-to-face and non-face-to-face time with patient on the  1. Cervicogenic headache   2. Bilateral occipital neuralgia   3. Cervicalgia   4. Snoring   5. Excessive daytime sleepiness   6. Cerebrovascular accident (CVA), unspecified mechanism (HCC)  7. 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 Hemisensory deficit   9. Positional headache   10. Vision changes    diagnosis.  This included previsit chart review, lab review, study review, order entry, electronic health record documentation, patient education on the different diagnostic and therapeutic options, counseling and coordination of care, risks and benefits of management, compliance, or risk factor reduction

## 2020-12-25 NOTE — Telephone Encounter (Signed)
[facility] order sent to [facility]. They will reach out to the patient to schedule.  

## 2020-12-26 LAB — BASIC METABOLIC PANEL
BUN/Creatinine Ratio: 9 (ref 9–20)
BUN: 15 mg/dL (ref 6–24)
CO2: 24 mmol/L (ref 20–29)
Calcium: 9.1 mg/dL (ref 8.7–10.2)
Chloride: 103 mmol/L (ref 96–106)
Creatinine, Ser: 1.67 mg/dL — ABNORMAL HIGH (ref 0.76–1.27)
Glucose: 145 mg/dL — ABNORMAL HIGH (ref 65–99)
Potassium: 3.8 mmol/L (ref 3.5–5.2)
Sodium: 144 mmol/L (ref 134–144)
eGFR: 51 mL/min/{1.73_m2} — ABNORMAL LOW (ref >59)

## 2020-12-26 NOTE — Telephone Encounter (Signed)
[facility] Berkley Harvey: [id number] (exp. 09/21/20 to 03/20/21)

## 2020-12-27 ENCOUNTER — Telehealth: Payer: Self-pay | Admitting: Neurology

## 2020-12-27 NOTE — Telephone Encounter (Signed)
Referral/MRI results & CD ([place]) mailed to the office of Dr. Ardell Isaacs @ [facility]. Address: [address], [place], [place] [address]. Phone: [phone].

## 2020-12-30 ENCOUNTER — Other Ambulatory Visit: Payer: Self-pay

## 2020-12-30 ENCOUNTER — Ambulatory Visit
Admission: RE | Admit: 2020-12-30 | Discharge: 2020-12-30 | Disposition: A | Discharge status: Home / Self Care | Payer: No Typology Code available for payment source | Source: Ambulatory Visit | Attending: Neurology | Admitting: Neurology

## 2020-12-30 DIAGNOSIS — R29818 Other symptoms and signs involving the nervous system: Secondary | ICD-10-CM

## 2020-12-30 DIAGNOSIS — R0683 Snoring: Secondary | ICD-10-CM

## 2020-12-30 DIAGNOSIS — G4486 Cervicogenic headache: Secondary | ICD-10-CM

## 2020-12-30 DIAGNOSIS — I639 Cerebral infarction, unspecified: Secondary | ICD-10-CM

## 2020-12-30 DIAGNOSIS — G4719 Other hypersomnia: Secondary | ICD-10-CM

## 2020-12-30 DIAGNOSIS — I69354 Hemiplegia and hemiparesis following cerebral infarction affecting left non-dominant side: Secondary | ICD-10-CM

## 2020-12-30 DIAGNOSIS — M5481 Occipital neuralgia: Secondary | ICD-10-CM

## 2020-12-30 DIAGNOSIS — H539 Unspecified visual disturbance: Secondary | ICD-10-CM

## 2020-12-30 DIAGNOSIS — R51 Headache with orthostatic component, not elsewhere classified: Secondary | ICD-10-CM

## 2021-01-04 ENCOUNTER — Other Ambulatory Visit: Payer: No Typology Code available for payment source

## 2021-01-07 ENCOUNTER — Telehealth: Payer: Self-pay | Admitting: *Deleted

## 2021-01-07 NOTE — Telephone Encounter (Signed)
-----   Message from Anson Fret, MD sent at 01/05/2021  1:16 PM EDT ----- Patient did not read his mychart results. Please call and let him know :  There is a small (lacunar) stroke on the left side of your brain as you mentioned, so this is not new. The rest of the brain is normal. Remember you should be on stroke prevention (aspirin or plavix daily) and you have to manage all your stroke-risk factors like blood pressure, diabetes, cholesterol and sleep apnea among other things, work with primary care thanks.

## 2021-01-07 NOTE — Telephone Encounter (Signed)
Spoke with patient's wife, Garrett Thompson (on [place]), discussed MRI brain results which showed a small (lacunar) stroke on the left side pt's brain. It is not new. The rest of the brain looked normal.  Patient should be on stroke prevention which will include aspirin and Plavix daily, and to manage all of his stroke-risk factors like BP, diabetes, cholesterol, and sleep apnea among other things, work with primary care.  She verbalized understanding and stated patient takes a baby aspirin daily.  He is on blood pressure medication and he has a pending appointment with Dr. Vickey Huger in October.  She verbalized appreciation for the call and she will pass message along to the patient.

## 2021-03-06 ENCOUNTER — Ambulatory Visit (INDEPENDENT_AMBULATORY_CARE_PROVIDER_SITE_OTHER): Payer: No Typology Code available for payment source | Admitting: Neurology

## 2021-03-06 ENCOUNTER — Encounter: Payer: Self-pay | Admitting: Neurology

## 2021-03-06 VITALS — BP 147/92 | HR 71 | Ht 73.0 in | Wt 257.5 lb

## 2021-03-06 DIAGNOSIS — G4719 Other hypersomnia: Secondary | ICD-10-CM | POA: Diagnosis not present

## 2021-03-06 DIAGNOSIS — M5481 Occipital neuralgia: Secondary | ICD-10-CM

## 2021-03-06 DIAGNOSIS — I639 Cerebral infarction, unspecified: Secondary | ICD-10-CM | POA: Diagnosis not present

## 2021-03-06 DIAGNOSIS — R29818 Other symptoms and signs involving the nervous system: Secondary | ICD-10-CM | POA: Diagnosis not present

## 2021-03-06 DIAGNOSIS — R0683 Snoring: Secondary | ICD-10-CM | POA: Insufficient documentation

## 2021-03-06 DIAGNOSIS — G4486 Cervicogenic headache: Secondary | ICD-10-CM

## 2021-03-06 DIAGNOSIS — M542 Cervicalgia: Secondary | ICD-10-CM

## 2021-03-06 MED ORDER — ALPRAZOLAM 0.25 MG PO TABS
ORAL_TABLET | ORAL | 0 refills | Status: DC
Start: 2021-03-06 — End: 2021-08-09

## 2021-03-06 NOTE — Progress Notes (Addendum)
SLEEP MEDICINE CLINIC    Provider:  Melvyn Novas, MD  Primary Care Physician:  Clinic, [place] [address] [facility] [place] [address]     Referring Provider: Anson Fret, Md [address] [place],  [place] [address]          Chief Complaint according to patient   Patient presents with:     New Patient (Initial Visit)           HISTORY OF PRESENT ILLNESS:  Garrett Lantz Hermann. is a 45 y.o. American male patient seen here as a referral on 03/06/2021 from Dr Lucia Gaskins.  Chief concern according to patient :  I have different headaches, 2-3 a day. Dr Lucia Gaskins noted that I wake up with HA and I snore, feel extremely sleepy, pain keeps him form sleeping. Sometimes he just falls asleep suddenly.   He described a cyclic pattern of not sleeping several night in a row and then crashes for 15 hours.  He was unable to tolerate EMG and NCV on his left foot.  MRI was ordered: brain.   MRI 12-31-2020, Dr Lucia Gaskins :  FINDINGS: Brain: There is no acute infarction or intracranial hemorrhage. There is no intracranial mass, mass effect, or edema. There is no hydrocephalus or extra-axial fluid collection. Chronic small vessel infarcts of the left caudate and putamen/corona radiata with chronic blood products. Ventricles and sulci are normal in size and configuration.  Vascular: Major vessel flow voids at the skull base are preserved.  Skull and upper cervical spine: Normal marrow signal is preserved.  Sinuses/Orbits: Paranasal sinuses are aerated. Orbits are unremarkable.  Other: Sella is unremarkable. Mastoid air cells are clear.  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  No acute abnormality.     Garrett Bennie Dallas.  has a past medical history of GERD (gastroesophageal reflux disease), Hypertension, and Stroke (HCC). He has HA and EDS since a surgery in January 2022. CVA in March. He reports a broken  leg, knee pain. He sleeps in little intervals, he is "needing his TV on"     Sleep relevant medical history: Delirium, chronic pain, HA, insomnia with pain. CVA described as spell with LOC, loss of awareness.  Snoring very loudly,  right sided sleep loudest.  Family medical /sleep history: no other family member on CPAP with OSA, insomnia, sleep walkers.    Social history:  Patient is working as a Insurance claims handler , out of work since [employer]-  and lives in a household with spouse and 4 children.  No longer driving.  Pets are present. Tobacco use-none .  ETOH use -not since March,  Caffeine intake in form of Coffee( /) Soda( 3 a day, 12 ounces) Tea ( /) 6 ounce cans of red bull energy drinks. Regular exercise in form of PT, treadmill.   Sleep habits are as follows: The patient's dinner time is between 6-6.30 PM. The patient goes to bed at 11 PM , but may have slept in front of the TV, and continues to sleep for 1 hour intervals- , wakes choking, and in pain,   The preferred sleep position is right side- and supine, with the support of 1-2 pillows.  Dreams are reportedly frequent/vivid. He has been woken by headaches from sleep.   No routine  AM -rise time.  The patient wakes up spontaneously.  He reports not feeling refreshed or restored in AM, with symptoms such  as dry mouth, morning headaches, and residual fatigue. Naps are taken frequently in front of the TV , lasting from 15 to 45 minutes and are more refreshing than nocturnal sleep.    Review of Systems: Out of a complete 14 system review, the patient complains of only the following symptoms, and all other reviewed systems are negative.:  Fatigue, sleepiness , snoring, fragmented sleep, Insomnia cyclic, chronic pain.    How likely are you to doze in the following situations: 0 = not likely, 1 = slight chance, 2 = moderate chance, 3 = high chance   Sitting and Reading? Watching Television? Sitting inactive in a public place  (theater or meeting)? As a passenger in a car for an hour without a break? Lying down in the afternoon when circumstances permit? Sitting and talking to someone? Sitting quietly after lunch without alcohol? In a car, while stopped for a few minutes in traffic?   Total = 13/ 24 points   FSS endorsed at 52/ 63 points.   Mind is racing chronic pain.   Social History   Socioeconomic History   Marital status: Married    Spouse name: stacy   Number of children: 7   Years of education: Not on file   Highest education level: Bachelor's degree (e.g., BA, AB, BS)  Occupational History   Not on file  Tobacco Use   Smoking status: Never   Smokeless tobacco: Never  Vaping Use   Vaping Use: Never used  Substance and Sexual Activity   Alcohol use: No   Drug use: No   Sexual activity: Not on file  Other Topics Concern   Not on file  Social History Narrative   Lives with wife Kennyth Arnold and 1 adult child and 3 minor children   Right handed   Caffeine: 4 sodas a day, 2 energy drinks day   Social Determinants of Health   Financial Resource Strain: Not on file  Food Insecurity: Not on file  Transportation Needs: Not on file  Physical Activity: Not on file  Stress: Not on file  Social Connections: Not on file    Family History  Problem Relation Age of Onset   Heart attack Mother    Diabetes Paternal Grandfather    High blood pressure Paternal Grandfather    Migraines Neg Hx     Past Medical History:  Diagnosis Date   GERD (gastroesophageal reflux disease)    Hypertension    Stroke (HCC)    at age 36    Past Surgical History:  Procedure Laterality Date   neck fusion     ORIF ANKLE FRACTURE Left 06/05/2020   Procedure: LEFT OPEN REDUCTION INTERNAL FIXATION (ORIF) ANKLE FRACTURE;  Surgeon: Cammy Copa, MD;  Location: [facility];  Service: Orthopedics;  Laterality: Left;   SPINAL FUSION  2009     Current Outpatient Medications on File Prior to Visit   Medication Sig Dispense Refill   pregabalin (LYRICA) 100 MG capsule Take 1 capsule by mouth 3 (three) times daily.     acetaminophen (TYLENOL) 325 MG tablet TAKE TWO TABLETS BY MOUTH 3 TIMES A DAY AS NEEDED FOR PAIN OR FEVER EACH TABLET CONTAINS 325MG  ACETAMINOPHEN (TYLENOL,APAP). MAXIMUM DAILY RECOMMENDED DOSE IS 3000MG      acetaminophen (TYLENOL) 500 MG tablet TAKE TWO TABLETS BY MOUTH THREE TIMES A DAY AS NEEDED FOR PAIN     albuterol (PROVENTIL HFA;VENTOLIN HFA) 108 (90 BASE) MCG/ACT inhaler Inhale 2 puffs into the lungs every 4 (four)  hours as needed for wheezing or shortness of breath (or coughing). 1 Inhaler 0   ALPRAZolam (XANAX) 0.25 MG tablet Take 1-2 tabs (0.25mg -0.50mg ) 30-60 minutes before procedure. May repeat if needed.Do not drive. 4 tablet 0   cetirizine (ZYRTEC) 10 MG tablet Take 1 tablet by mouth daily.     fluticasone (FLONASE) 50 MCG/ACT nasal spray INSTILL 1 SPRAY IN EACH NOSTRIL TWICE A DAY     hydrochlorothiazide (HYDRODIURIL) 25 MG tablet Take 0.5 tablets by mouth daily.     lisinopril (PRINIVIL,ZESTRIL) 5 MG tablet Take 2.5 mg by mouth daily.     omeprazole (PRILOSEC) 10 MG capsule Take 20 mg by mouth daily.     oxyCODONE-acetaminophen (PERCOCET) 5-325 MG tablet Take 1 tablet by mouth every 8 (eight) hours as needed for severe pain. 30 tablet 0   SUMAtriptan (IMITREX) 100 MG tablet TAKE ONE TABLET BY MOUTH AS DIRECTED (FIRST DOSE AT ONSET OF HEADACHE,THEN REPEAT AFTER 2 HOURS IF NO RELIEF-NO MORE THAN 200 MG PER DAY)     tiZANidine (ZANAFLEX) 2 MG tablet Take 2 mg by mouth 2 (two) times daily.     No current facility-administered medications on file prior to visit.    Allergies  Allergen Reactions   Mobic [Meloxicam]     Nose Bleed   Mushroom Extract Complex    Shellfish Allergy     Physical exam:  Today's Vitals   03/06/21 1022  BP: (!) 147/92  Pulse: 71  Weight: 257 lb 8 oz (116.8 kg)  Height: 6\' 1"  (1.854 m)   Body mass index is 33.97 kg/m.   Wt  Readings from Last 3 Encounters:  03/06/21 257 lb 8 oz (116.8 kg)  12/25/20 256 lb 9.6 oz (116.4 kg)  06/05/20 241 lb 2.9 oz (109.4 kg)     Ht Readings from Last 3 Encounters:  03/06/21 6\' 1"  (1.854 m)  12/25/20 6\' 1"  (1.854 m)  06/05/20 6\' 1"  (1.854 m)      General: The patient is awake, alert and appears not in acute distress. The patient is well groomed. Head: Normocephalic, atraumatic. Neck is supple. Mallampati 1-2 ,  neck circumference:17.5  inches . Nasal airflow  patent.  Retrognathia is  seen.  Dental status:  Cardiovascular:  Regular rate and cardiac rhythm by pulse,  without distended neck veins. Respiratory: Lungs are clear to auscultation.  Skin:  Without evidence of ankle edema, or rash. Trunk: The patient's posture is erect.   Neurologic exam : The patient is awake and alert, oriented to place and time.   Memory subjective described as intact.  Attention span & concentration ability appears normal.  Speech is fluent,  without  dysarthria, dysphonia or aphasia.  Mood and affect are appropriate.   Cranial nerves: no loss of smell or taste reported . Photosensitivity in right eye, severe, can't tolerate exam.  Pupils are equal and briskly reactive to light. Funduscopic exam see above. 12/27/20  Extraocular movements in vertical and horizontal planes were intact and without nystagmus. No Diplopia. Visual fields by finger perimetry are intact. Hearing was intact to soft voice and finger rubbing.    Facial sensation intact to fine touch.  Facial motor strength is symmetric and tongue and uvula move midline. No tremor,  Neck ROM : rotation, tilt and flexion extension were normal for age and shoulder shrug was symmetrical.    Motor exam:  left bulk, left reduced tone and ROM.   Normal tone without cog-wheeling, loss of left grip strength .  Sensory:  Fine touch, pinprick and vibration were reported absent in the left body-  Proprioception tested in the upper extremities was  normal.   Coordination: Rapid alternating movements in the fingers/hands were of normal speed.  The Finger-to-nose maneuver was iimpaired  with evidence of ataxia, dysmetria - left sided, but no tremor.    Gait and station: Patient could rise unassisted from a seated position, walked without assistive device.  Toe and heel walk were deferred.  Deep tendon reflexes: in the  upper and lower extremities are asymmetric and intact.  Babinski response was deferred.      After spending a total time of  45  minutes face to face and additional time for physical and neurologic examination, review of laboratory studies,  personal review of imaging studies, reports and results of other testing and review of referral information / records as far as provided in visit, I have established the following assessments: Mr. Stuck had a very tough year 2022.  He was referred to Dr. Reece Agar V for nerve block branch blocks for occipital neuralgia by Dr. Lucia Gaskins, she had obtained an MRI of the brain without contrast due to creatinine being elevated.    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  Cervicalgia is still present, he reports excessive daytime sleepiness and his wife has stated that he is a loud snorer.  Since hemiparesis affected his left side his stroke must have affected the right brain hemisphere which is more likely to leave a patient with significant sleepiness, sometimes also with affect changes lethargy etc.    There was significant photophobia noted during today's eye exam.  The headaches sometimes wake him at night discomfort also from ankle and knee can wake him at night and he has reported an inability to sleep for longer periods of time so sleep very fragmented sleep happens sometimes throughout the day and night with lots of interruptions in between and was no longer existing external routines or rituals.   He is not just sleepy he is excessively daytime fatigue.    I reviewed his medication, since he is also on Percocets not he is no longer on Percocet gabapentin or Flexeril all of these could have contributed to sleepiness of course.  He does have some Xanax as a rescue medication he does have Ventolin, Flonase hydroureteral hydrochlorothiazide which is a diuretic, Zestril Prilosec he is not on tizanidine or Imitrex anymore.  So what we are going to do next is order a sleep test I would be happy to order a home sleep test for him he may sleep just better at home than in the lab but I believe that to him.    My Plan is to proceed with:  1) HST preferred, he can take 0.25 mg Xanax- to help sleep, avoid benadryl.  2) occipital neuralgia - Dr Lucia Gaskins referred to Dr. Jordan Likes.    I would like to thank Clinic, [facility]- and Anson Fret, Md [address] [place],  [place] [address] for allowing me to meet with and to take care of this pleasant patient.   II plan to follow up either personally or through our NP within 3-4 month.     Electronically signed by: Melvyn Novas, MD 03/06/2021 11:25 AM  [facility] Board certified by The [employer] of Sleep Medicine and Diplomate of the [employer] of Sleep Medicine.  Board certified In Neurology through the [employer], Fellow of the [employer] of Neurology. Medical Director of [employer].

## 2021-04-01 NOTE — Progress Notes (Addendum)
[facility]    Provider:  Dr Jaynee Eagles Requesting Provider: [facility] Primary Care Provider:  [facility]  CC:  neck pain and shooting pain up the back of the head, sleep apnea, stroke  I highly recommend seeing Dr. Vira Blanco to see if there is anything at all that can be done injection wise,RFA or otherwise to help with the neck pain. [name], he has diabetes, HTN, hld, [name], [name], cholesterol medication,   [name] The headaches start in the back of the head and radiate to the top of the head. Lightning bolts in his head. Throbbing in between. He has having physical therapy, he says he has stroke but I have no records. He had a CT scan and he was told her had a stroke but never had an MRI of the brain. He has uncontrolled high blood pressure. He says he had an episode where he had an odd episode in March. His wife also provides much information. The pain is shooting up the back of the neck. He has also thought about a spinal stimulator. Tried ESI injections but not medial branch blocks. Not migrainous, no migraine symptoms. Triptans did not help. Tried Gabapentin. Headaches are daily, shooting with throbbing in between. Also lots of neck pain. He is doing physical therapy. Ongoing for over a year. Worsening. Also blurry vision. He also snores heavily, recommended sleep study as well, discussed. No hx of migraines. Very tired, snore heavily, Significant neck pain. Wife says he snores heavily, he falls asleep instantaneously.   Reviewed notes, labs and imaging from outside physicians, which showed:  I reviewed MRI of the cervical spine images and agree with the  following: C1-C2 and craniocervical junction unremarkable.  C2-C3 moderate left uncovertebral osteophytosis and mild left facet arthrosis with mild left neural foraminal narrowing.  C3-C4 moderate bulging disc osteophyte complex moderate left uncovertebral osteophytosis mild left facet arthrosis mild spinal canal stenosis and minimal deformity of the central cord.  Left vertebral artery loop extending medially may contribute to compressive radiculopathy upon exiting left C4 nerve roots.  C4-C5 moderate to severe broad central disc osteophyte complex with mild spinal canal stenosis and mild deformity of central cord.  Anterior discectomy and interbody fusion without spinal canal stenosis or neuroforaminal narrowing.  C6-C7 small central disc protrusion superimposed upon moderate bulging disc osteophyte complex with moderate to severe left and moderate right uncovertebral osteophytosis.  No spinal canal stenosis or deformity of the cord.  Mild-moderate left and mild right neural foraminal narrowing.  Mild bilateral uncovertebral osteophytosis more pronounced on the right without neuroforaminal narrowing.  Review of Systems: Patient complains of symptoms per HPI as well as the following symptoms: fatigue,ankle pain . Pertinent negatives and positives per HPI. All others negative    Social History   Socioeconomic History   Marital status: Married    Spouse name: stacy   Number of children: 7   Years of education: Not on file   Highest education level: Bachelor's degree (e.g., BA, AB, BS)  Occupational History   Not on file  Tobacco Use   Smoking status: Never   Smokeless tobacco: Never  Vaping Use   Vaping Use: Never used  Substance and Sexual Activity   Alcohol use: Yes    Comment: occ   Drug use: No   Sexual activity: Not on file  Other Topics Concern   Not on file  Social History Narrative   Lives with wife Marzetta Board and 1 adult child and 3 minor children   Right handed   Caffeine: 4 sodas  a day, 2 energy drinks day   Social Determinants of Health   Financial Resource Strain: Not on file  Food Insecurity: Not on file  Transportation Needs: Not on file  Physical Activity: Not on file  Stress: Not on file  Social Connections: Not on file  Intimate Partner Violence: Not on file    Family History  Problem Relation Age of Onset   Heart attack Mother    Diabetes Paternal Grandfather    High blood pressure Paternal Grandfather    Migraines Neg Hx     Past Medical History:  Diagnosis Date   GERD (gastroesophageal reflux disease)    Hypertension    Stroke ([place])    at age 40    Patient Active Problem List   Diagnosis Date Noted   Cerebrovascular accident (CVA) ([place]) 03/06/2021   Excessive daytime sleepiness 03/06/2021   Hemisensory deficit 03/06/2021   Loud snoring 03/06/2021   Cervicogenic headache 12/25/2020   Bilateral occipital neuralgia 12/25/2020   Cervicalgia 12/25/2020     Past Surgical History:  Procedure Laterality Date   neck fusion     ORIF ANKLE FRACTURE Left 06/05/2020   Procedure: LEFT OPEN REDUCTION INTERNAL FIXATION (ORIF) ANKLE FRACTURE;  Surgeon: Meredith Pel, MD;  Location: [facility];  Service: Orthopedics;  Laterality: Left;   SPINAL FUSION  2009    Current Outpatient Medications  Medication Sig Dispense Refill   acetaminophen (TYLENOL) 325 MG tablet TAKE TWO TABLETS BY MOUTH 3 TIMES A DAY AS NEEDED FOR PAIN OR FEVER EACH TABLET CONTAINS 325MG  ACETAMINOPHEN (TYLENOL,APAP). MAXIMUM DAILY RECOMMENDED DOSE IS 3000MG      acetaminophen (TYLENOL) 500 MG tablet TAKE TWO TABLETS BY MOUTH THREE TIMES A DAY AS NEEDED FOR PAIN     albuterol (PROVENTIL HFA;VENTOLIN HFA) 108 (90 BASE) MCG/ACT inhaler Inhale 2 puffs into the lungs every 4 (four) hours as needed for wheezing or shortness of breath (or coughing). 1 Inhaler 0   ALPRAZolam (XANAX) 0.25 MG tablet Take 1-2 tabs (0.25mg -0.50mg ) 30-60 minutes before procedure. May repeat  if needed.Do not drive. 4 tablet 0   cetirizine (ZYRTEC) 10 MG tablet Take 1 tablet by mouth daily.     fluticasone (FLONASE) 50 MCG/ACT nasal spray INSTILL 1 SPRAY IN EACH NOSTRIL TWICE A DAY     hydrochlorothiazide (HYDRODIURIL) 25 MG tablet Take 0.5 tablets by mouth daily.     lisinopril (PRINIVIL,ZESTRIL) 5 MG tablet Take 2.5 mg by mouth daily.     Melatonin 10 MG CAPS Take 10 mg by mouth at bedtime.     omeprazole (PRILOSEC) 10 MG capsule Take 20 mg by mouth daily.     pregabalin (LYRICA) 100 MG capsule Take 1 capsule by mouth 3 (three) times daily.     traZODone (DESYREL) 100 MG tablet Take 1 tablet by mouth at bedtime as needed.     No current facility-administered medications for this visit.    Allergies as of 04/08/2021 - Review Complete 04/08/2021  Allergen Reaction Noted   Mobic [meloxicam]  05/10/2013   Mushroom extract complex  05/31/2020  Shellfish allergy  05/31/2020    Vitals: BP (!) 133/92   Pulse 69   Ht 6\' 1"  (1.854 m)   Wt 261 lb 9.6 oz (118.7 kg)   BMI 34.51 kg/m  Last Weight:  Wt Readings from Last 1 Encounters:  04/08/21 261 lb 9.6 oz (118.7 kg)   Last Height:   Ht Readings from Last 1 Encounters:  04/08/21 6\' 1"  (1.854 m)    Exam: NAD, pleasant                  Speech:    Speech is normal; fluent and spontaneous with normal comprehension.  Cognition:    The patient is oriented to person, place, and time;     recent and remote memory intact;     language fluent;    Cranial Nerves:    The pupils are equal, round, and reactive to light.Trigeminal sensation is intact and the muscles of mastication are normal. The face is symmetric. The palate elevates in the midline. Hearing intact. Voice is normal. Shoulder shrug is normal. The tongue has normal motion without fasciculations.   Coordination:  No dysmetria  Motor Observation:    No asymmetry, no atrophy, and no involuntary movements noted. Tone:    Normal muscle tone.     Strength:     Strength is V/V in the upper and lower limbs.      Sensation: intact to LT      Assessment/Plan: This is a 45 year old patient w stroke, sleep apnea. Also chronic meck pain that we cannot manage here in neurology, see below  - Sleep apnea evaluation: saw Dr. , pending sleep exam. F/u 6 months for cpap.  - MRI of the brain;: showed multiple lacunar stroke likely due to multiple vascular risk factors ([name], HTN, OBESITY, UNTREATED SLEEP APNEA). Complete sleep eval workup including imaging blood vessels, labs, echocardiogram, heart monitor.  - Chronic neck pain and cervicalgia: We cannot do more for patient here in neurology. He has to be treated by pain management. I have also recommended seeing Dr. 49 for eval of other injections to his neck. But his headaches are cervicogenic and due to chronic neck pain which we do not manage. REFERRED TO PAIN MANAGEMENT and in addition maybe Dr. Vickey Huger can try some different injections to help otherwise pain management, -Refer to Dr. Jordan Likes - can see if he can give other options for nerve blocks, medial branch blocks or other; Cervicalgia and occipital neuralgia. Evaluate and treat including medial branch blocks or other procedures as appropriate. They did not respond, gave them number to call them.  (Addendum 04/17/2021: telephone call. His cholesterol panel is very abnormal, his bad cholesterol LDL is 179, he needs to get that down to <70, I highly recommend seeing his primary care for a statin(ask him where to send these results). He is diabetic with hgba1c of 7.1, he should be treated for diabetes and again I encourage him to see his pcp. His kidney function is impaired, but this is not new he has CKD. He has at least one gene for sickle cell and he should discuss that with his primary care. The other labs are ok.   Also, if he has no contraindications to aspirin, he should be on asa 81mg  daily for stroke prevention. I'd like to send these to his  primary care if you can get his information thanks)  Orders Placed This Encounter  Procedures   Basic Metabolic Panel   Lipid Panel  Hemoglobin A1c   Comprehensive metabolic panel   CBC with Differential/Platelets   Prothrombin gene mutation   Cardiolipin antibodies, IgG, IgM, IgA   Factor 5 leiden   Homocysteine   Beta-2-glycoprotein i abs, IgG/M/A   Lupus anticoagulant   Protein S, total   Protein S activity   Protein C activity   Protein C, total   Antithrombin III   Sickle Cell Screen   Ambulatory referral to Pain Clinic   Cardiac event monitor   ECHOCARDIOGRAM COMPLETE BUBBLE STUDY   VAS US CAROTID    No orders of the defined types were placed in this encounter.   Cc: [facility],  Clinic, Christean Grief, MD  [facility] [address] [place],  [address]  Phone [phone] Fax [phone]  I spent over 90 minutes of face-to-face and non-face-to-face time with patient on the  1. Cerebrovascular accident (CVA), unspecified mechanism ([place])   2. Chronic kidney disease, unspecified CKD stage   3. Hypercoagulable state ([place])   4. Type 2 diabetes mellitus with other specified complication, unspecified whether long term insulin use ([place])   5. Chronic neck pain   6. Hypertension, unspecified type     diagnosis.  This included previsit chart review, lab review, study review, order entry, electronic health record documentation, patient education on the different diagnostic and therapeutic options, counseling and coordination of care, risks and benefits of management, compliance, or risk factor reduction

## 2021-04-08 ENCOUNTER — Ambulatory Visit (INDEPENDENT_AMBULATORY_CARE_PROVIDER_SITE_OTHER): Payer: No Typology Code available for payment source | Admitting: Neurology

## 2021-04-08 ENCOUNTER — Ambulatory Visit (INDEPENDENT_AMBULATORY_CARE_PROVIDER_SITE_OTHER): Payer: No Typology Code available for payment source

## 2021-04-08 ENCOUNTER — Other Ambulatory Visit: Payer: Self-pay | Admitting: Neurology

## 2021-04-08 ENCOUNTER — Telehealth: Payer: Self-pay | Admitting: *Deleted

## 2021-04-08 ENCOUNTER — Encounter: Payer: Self-pay | Admitting: Neurology

## 2021-04-08 VITALS — BP 133/92 | HR 69 | Ht 73.0 in | Wt 261.6 lb

## 2021-04-08 DIAGNOSIS — M542 Cervicalgia: Secondary | ICD-10-CM

## 2021-04-08 DIAGNOSIS — G4719 Other hypersomnia: Secondary | ICD-10-CM

## 2021-04-08 DIAGNOSIS — N189 Chronic kidney disease, unspecified: Secondary | ICD-10-CM

## 2021-04-08 DIAGNOSIS — I639 Cerebral infarction, unspecified: Secondary | ICD-10-CM

## 2021-04-08 DIAGNOSIS — E1169 Type 2 diabetes mellitus with other specified complication: Secondary | ICD-10-CM

## 2021-04-08 DIAGNOSIS — G8929 Other chronic pain: Secondary | ICD-10-CM

## 2021-04-08 DIAGNOSIS — D6859 Other primary thrombophilia: Secondary | ICD-10-CM | POA: Diagnosis not present

## 2021-04-08 DIAGNOSIS — I4891 Unspecified atrial fibrillation: Secondary | ICD-10-CM

## 2021-04-08 DIAGNOSIS — I10 Essential (primary) hypertension: Secondary | ICD-10-CM

## 2021-04-08 NOTE — Addendum Note (Signed)
Addended by: Marlana Latus C on: 04/08/2021 11:00 AM   Modules accepted: Orders

## 2021-04-08 NOTE — Progress Notes (Unsigned)
z

## 2021-04-08 NOTE — Telephone Encounter (Signed)
Per previous visit with Dr Vickey Huger in October she wanted to complete HST. Order has been placed for the pt.

## 2021-04-08 NOTE — Patient Instructions (Addendum)
Echocardiogram of the heart CTA of the head (CT Angiogram) of the blood vessels of the head and neck. If I can;t give him contrast(depends on blood work results), will order an ultrasound of the neck Heart monitor; Atrial Fibrillation - will call to schedule See Dr. Vira Blanco and Pain management Sleep evaluation  Ischemic Stroke An ischemic stroke (cerebrovascular accident, CVA) occurs when an area of the brain does not get enough blood flow. This leads to the sudden death of brain tissue and can cause brain damage. An ischemic stroke is a medical emergency. It must be treated right away. What are the causes? This condition is caused by a decrease of blood flow to a part of the brain. This may be due to: A small blood clot (embolus). A buildup of plaque in the blood vessels (atherosclerosis). This blocks blood flow in the brain. An abnormal heart rhythm, called atrial fibrillation (AFib). This sends a small blood clot to the brain. A blocked or damaged artery in the head or neck. Certain infections. Inflammation of the arteries in the brain (vasculitis). Sometimes, the cause of ischemic stroke is not known. What increases the risk? The following medical conditions may increase your risk of a stroke: High blood pressure (hypertension). Heart disease. Diabetes. High cholesterol. Obesity. Sleep problems (sleep apnea). Other risk factors that you can change include: Using products that contain nicotine or tobacco. Not being active. Heavy use of alcohol or drugs, especially cocaine and methamphetamine. Taking birth control pills, especially if you also use tobacco. Risk factors that you cannot change include: Being older than age 19. Having a history of blood clots, stroke, or mini-stroke (transient ischemic attack, TIA). Having a family history of stroke. What are the signs or symptoms? Symptoms of this condition usually develop suddenly, or you may notice them after waking from sleep.  These may include: Weakness or numbness of your face, arm, or leg, especially on one side of your body. Loss of balance or coordination. Slurred speech, trouble speaking, trouble understanding speech, or a combination of these. Vision changes. You may have double vision, blurred vision, or loss of vision. Dizziness or confusion. Nausea and vomiting. Severe headache. If possible, write down the exact time your symptoms started. Tell your health care provider. If symptoms come and go, they could be signs of a TIA. Get help right away, even if you feel better. How is this diagnosed? This condition may be diagnosed based on: Your symptoms, your medical history, and a physical exam. CT scan of the brain. MRI. Imaging tests that scan blood flow in the brain (CT angiogram, MRI angiogram, or cerebral angiogram). You may also have other tests, including: Electrocardiogram (ECG). Continuous heart monitoring. Transthoracic echocardiogram (TTE). Transesophageal echocardiogram (TEE). Carotid ultrasound. Blood tests. Sleep study to check for sleep apnea. You may need to see a health care provider who specializes in stroke care. How is this treated? Treatment for this condition depends on the area of the brain affected and the cause of your symptoms. Some treatments work better if they are done within 3-6 hours of the start of symptoms. These may include: Medicine that is injected to dissolve the blood clot. Treatments given directly to the affected artery to remove or dissolve the blood clot. Medicines to control blood pressure. Medicines to thin the blood (anticoagulant or antiplatelet). Other treatments may include: Oxygen. IV fluids. Procedures to increase blood flow. After a stroke, you may work with physical, speech, mental health, or occupational therapists to help you recover.  Follow these instructions at home: Medicines Take over-the-counter and prescription medicines only as told by  your health care provider. If you were told to take a medicine to thin your blood, take it exactly as told, at the same time every day. Taking too much of a blood thinner can cause bleeding. Taking too little may not protect you against a stroke and other problems. If you were not prescribed medicines that contain aspirin or NSAIDs, such as ibuprofen, talk with your health care provider before you take any of these. These medicines increase your risk for dangerous bleeding. When taking a blood thinner, do these things: Hold pressure over any cuts that bleed for longer than usual. Tell your dentist and other health care providers that you are taking anticoagulants before having any procedures that may cause bleeding. Avoid activities that could cause injury or bruising. Wear a medical alert bracelet or carry a card that lists what medicines you take. Eating and drinking Follow instructions from your health care provider about diet. Eat healthy foods. If your stroke affected your ability to swallow, you may need to take steps to avoid choking. These may include: Taking small bites of food. Eating soft or pureed foods. Safety Follow instructions from your health care team about physical activity. Use a walker or cane as told by your health care provider. Take steps to lower your risk of falls at home. These may include: Installing grab bars in the bedroom and bathroom. Using raised toilets and putting a seat in the shower. Removing clutter and tripping hazards, such as cords or area rugs. General instructions Do not use any products that contain nicotine or tobacco. These include cigarettes, chewing tobacco, and vaping devices, such as e-cigarettes. If you need help quitting, ask your health care provider. If you drink alcohol: Limit how much you have to: 0-1 drink a day for women who are not pregnant. 0-2 drinks a day for men. Know how much alcohol is in your drink. In the [place] one drink  equals one 12 oz bottle of beer (355 mL), one 5 oz glass of wine (148 mL), or one 1 oz glass of hard liquor (44 mL). Keep all follow-up visits. This is important. How is this prevented? You can lower your risk of another stroke by managing these conditions: High blood pressure. High cholesterol. Diabetes. Heart disease. Sleep apnea. Obesity. Quitting smoking, limiting alcohol, and staying physically active also will reduce your risk. Your health care provider will continue to help you with ways to prevent short-term and long-term problems caused by stroke. Get help right away if: You have any symptoms of a stroke. "BE FAST" is an easy way to remember the main warning signs of a stroke: B - Balance. Signs are dizziness, sudden trouble walking, or loss of balance. E - Eyes. Signs are trouble seeing or a sudden change in vision. F - Face. Signs are sudden weakness or numbness of the face, or the face or eyelid drooping on one side. A - Arms. Signs are weakness or numbness in an arm. This happens suddenly and usually on one side of the body. S - Speech. Signs are sudden trouble speaking, slurred speech, or trouble understanding what people say. T - Time. Time to call emergency services. Write down what time symptoms started. You have other signs of a stroke, such as: A sudden, severe headache with no known cause. Nausea or vomiting. Seizure. These symptoms may represent a serious problem that is an emergency. Do not  wait to see if the symptoms will go away. Get medical help right away. Call your local emergency services (911 in the [place] Do not drive yourself to the hospital. Summary An ischemic stroke (cerebrovascular accident, CVA) occurs when an area of the brain does not get enough blood flow. Symptoms of this condition usually develop suddenly, or you may notice them after waking from sleep. It is very important to get treatment at the first sign of stroke symptoms. Stroke is a medical  emergency that must be treated right away. This information is not intended to replace advice given to you by your health care provider. Make sure you discuss any questions you have with your health care provider. Document Revised: 12/07/2019 Document Reviewed: 12/07/2019 Elsevier Patient Education  2022 [employer]. Stroke Prevention Some medical conditions and behaviors can lead to a higher chance of having a stroke. You can help prevent a stroke by eating healthy, exercising, not smoking, and managing any medical conditions you have. Stroke is a leading cause of functional impairment. Primary prevention is particularly important because a majority of strokes are first-time events. Stroke changes the lives of not only those who experience a stroke but also their family and other caregivers. How can this condition affect me? A stroke is a medical emergency and should be treated right away. A stroke can lead to brain damage and can sometimes be life-threatening. If a person gets medical treatment right away, there is a better chance of surviving and recovering from a stroke. What can increase my risk? The following medical conditions may increase your risk of a stroke: Cardiovascular disease. High blood pressure (hypertension). Diabetes. High cholesterol. Sickle cell disease. Blood clotting disorders (hypercoagulable state). Obesity. Sleep disorders (obstructive sleep apnea). Other risk factors include: Being older than age 11. Having a history of blood clots, stroke, or mini-stroke (transient ischemic attack, TIA). Genetic factors, such as race, ethnicity, or a family history of stroke. Smoking cigarettes or using other tobacco products. Taking birth control pills, especially if you also use tobacco. Heavy use of alcohol or drugs, especially cocaine and methamphetamine. Physical inactivity. What actions can I take to prevent this? Manage your health conditions High cholesterol  levels. Eating a healthy diet is important for preventing high cholesterol. If cholesterol cannot be managed through diet alone, you may need to take medicines. Take any prescribed medicines to control your cholesterol as told by your health care provider. Hypertension. To reduce your risk of stroke, try to keep your blood pressure below 130/80. Eating a healthy diet and exercising regularly are important for controlling blood pressure. If these steps are not enough to manage your blood pressure, you may need to take medicines. Take any prescribed medicines to control hypertension as told by your health care provider. Ask your health care provider if you should monitor your blood pressure at home. Have your blood pressure checked every year, even if your blood pressure is normal. Blood pressure increases with age and some medical conditions. Diabetes. Eating a healthy diet and exercising regularly are important parts of managing your blood sugar (glucose). If your blood sugar cannot be managed through diet and exercise, you may need to take medicines. Take any prescribed medicines to control your diabetes as told by your health care provider. Get evaluated for obstructive sleep apnea. Talk to your health care provider about getting a sleep evaluation if you snore a lot or have excessive sleepiness. Make sure that any other medical conditions you have, such as atrial  fibrillation or atherosclerosis, are managed. Nutrition Follow instructions from your health care provider about what to eat or drink to help manage your health condition. These instructions may include: Reducing your daily calorie intake. Limiting how much salt (sodium) you use to 1,500 milligrams (mg) each day. Using only healthy fats for cooking, such as olive oil, canola oil, or sunflower oil. Eating healthy foods. You can do this by: Choosing foods that are high in fiber, such as whole grains, and fresh fruits and  vegetables. Eating at least 5 servings of fruits and vegetables a day. Try to fill one-half of your plate with fruits and vegetables at each meal. Choosing lean protein foods, such as lean cuts of meat, poultry without skin, fish, tofu, beans, and nuts. Eating low-fat dairy products. Avoiding foods that are high in sodium. This can help lower blood pressure. Avoiding foods that have saturated fat, trans fat, and cholesterol. This can help prevent high cholesterol. Avoiding processed and prepared foods. Counting your daily carbohydrate intake.  Lifestyle If you drink alcohol: Limit how much you have to: 0-1 drink a day for women who are not pregnant. 0-2 drinks a day for men. Know how much alcohol is in your drink. In the [place] one drink equals one 12 oz bottle of beer (355mL), one 5 oz glass of wine (148mL), or one 1 oz glass of hard liquor (44mL). Do not use any products that contain nicotine or tobacco. These products include cigarettes, chewing tobacco, and vaping devices, such as e-cigarettes. If you need help quitting, ask your health care provider. Avoid secondhand smoke. Do not use drugs. Activity  Try to stay at a healthy weight. Get at least 30 minutes of exercise on most days, such as: Fast walking. Biking. Swimming. Medicines Take over-the-counter and prescription medicines only as told by your health care provider. Aspirin or blood thinners (antiplatelets or anticoagulants) may be recommended to reduce your risk of forming blood clots that can lead to stroke. Avoid taking birth control pills. Talk to your health care provider about the risks of taking birth control pills if: You are over 46 years old. You smoke. You get very bad headaches. You have had a blood clot. Where to find more information American Stroke Association: www.strokeassociation.org Get help right away if: You or a loved one has any symptoms of a stroke. "BE FAST" is an easy way to remember the main  warning signs of a stroke: B - Balance. Signs are dizziness, sudden trouble walking, or loss of balance. E - Eyes. Signs are trouble seeing or a sudden change in vision. F - Face. Signs are sudden weakness or numbness of the face, or the face or eyelid drooping on one side. A - Arms. Signs are weakness or numbness in an arm. This happens suddenly and usually on one side of the body. S - Speech. Signs are sudden trouble speaking, slurred speech, or trouble understanding what people say. T - Time. Time to call emergency services. Write down what time symptoms started. You or a loved one has other signs of a stroke, such as: A sudden, severe headache with no known cause. Nausea or vomiting. Seizure. These symptoms may represent a serious problem that is an emergency. Do not wait to see if the symptoms will go away. Get medical help right away. Call your local emergency services (911 in the [place] Do not drive yourself to the hospital. Summary You can help to prevent a stroke by eating healthy, exercising, not smoking,  limiting alcohol intake, and managing any medical conditions you may have. Do not use any products that contain nicotine or tobacco. These include cigarettes, chewing tobacco, and vaping devices, such as e-cigarettes. If you need help quitting, ask your health care provider. Remember "BE FAST" for warning signs of a stroke. Get help right away if you or a loved one has any of these signs. This information is not intended to replace advice given to you by your health care provider. Make sure you discuss any questions you have with your health care provider. Document Revised: 11/28/2019 Document Reviewed: 11/28/2019 Elsevier Patient Education  [employer].

## 2021-04-08 NOTE — Telephone Encounter (Signed)
Patient  was suppose to have sleep study ordered but has not been ordered yet

## 2021-04-10 ENCOUNTER — Telehealth: Payer: Self-pay | Admitting: Neurology

## 2021-04-10 NOTE — Telephone Encounter (Signed)
Pain management referral sent to [facility]. Phone: [phone]. 

## 2021-04-11 ENCOUNTER — Encounter: Payer: Self-pay | Admitting: *Deleted

## 2021-04-11 ENCOUNTER — Ambulatory Visit (HOSPITAL_COMMUNITY)
Admission: RE | Admit: 2021-04-11 | Discharge: 2021-04-11 | Disposition: A | Discharge status: Home / Self Care | Payer: No Typology Code available for payment source | Source: Ambulatory Visit | Attending: Neurology | Admitting: Neurology

## 2021-04-11 ENCOUNTER — Other Ambulatory Visit: Payer: Self-pay

## 2021-04-11 DIAGNOSIS — I639 Cerebral infarction, unspecified: Secondary | ICD-10-CM

## 2021-04-15 LAB — CBC WITH DIFFERENTIAL/PLATELET
Basophils Absolute: 0 10*3/uL (ref 0.0–0.2)
Basos: 0 %
EOS (ABSOLUTE): 0.2 10*3/uL (ref 0.0–0.4)
Eos: 5 %
Hematocrit: 40.7 % (ref 37.5–51.0)
Hemoglobin: 14 g/dL (ref 13.0–17.7)
Immature Grans (Abs): 0 10*3/uL (ref 0.0–0.1)
Immature Granulocytes: 0 %
Lymphocytes Absolute: 1.5 10*3/uL (ref 0.7–3.1)
Lymphs: 32 %
MCH: 27.6 pg (ref 26.6–33.0)
MCHC: 34.4 g/dL (ref 31.5–35.7)
MCV: 80 fL (ref 79–97)
Monocytes Absolute: 0.4 10*3/uL (ref 0.1–0.9)
Monocytes: 8 %
Neutrophils Absolute: 2.5 10*3/uL (ref 1.4–7.0)
Neutrophils: 55 %
Platelets: 252 10*3/uL (ref 150–450)
RBC: 5.07 x10E6/uL (ref 4.14–5.80)
RDW: 14.7 % (ref 11.6–15.4)
WBC: 4.6 10*3/uL (ref 3.4–10.8)

## 2021-04-15 LAB — PROTEIN S, TOTAL: Protein S Ag, Total: 112 % (ref 60–150)

## 2021-04-15 LAB — PROTEIN C ACTIVITY: Protein C Activity: >199 % — ABNORMAL HIGH (ref 73–180)

## 2021-04-15 LAB — COMPREHENSIVE METABOLIC PANEL
ALT: 44 IU/L (ref 0–44)
AST: 34 IU/L (ref 0–40)
Albumin/Globulin Ratio: 1.8 (ref 1.2–2.2)
Albumin: 5 g/dL (ref 4.0–5.0)
Alkaline Phosphatase: 112 IU/L (ref 44–121)
BUN/Creatinine Ratio: 7 — ABNORMAL LOW (ref 9–20)
BUN: 12 mg/dL (ref 6–24)
Bilirubin Total: 0.6 mg/dL (ref 0.0–1.2)
CO2: 24 mmol/L (ref 20–29)
Calcium: 9.2 mg/dL (ref 8.7–10.2)
Chloride: 104 mmol/L (ref 96–106)
Creatinine, Ser: 1.7 mg/dL — ABNORMAL HIGH (ref 0.76–1.27)
Globulin, Total: 2.8 g/dL (ref 1.5–4.5)
Glucose: 149 mg/dL — ABNORMAL HIGH (ref 70–99)
Potassium: 3.6 mmol/L (ref 3.5–5.2)
Sodium: 143 mmol/L (ref 134–144)
Total Protein: 7.8 g/dL (ref 6.0–8.5)
eGFR: 50 mL/min/{1.73_m2} — ABNORMAL LOW (ref >59)

## 2021-04-15 LAB — SICKLE CELL SCREEN: Sickle Cell Screen: POSITIVE — AB

## 2021-04-15 LAB — LIPID PANEL
Chol/HDL Ratio: 6.7 ratio — ABNORMAL HIGH (ref 0.0–5.0)
Cholesterol, Total: 256 mg/dL — ABNORMAL HIGH (ref 100–199)
HDL: 38 mg/dL — ABNORMAL LOW (ref >39)
LDL Chol Calc (NIH): 179 mg/dL — ABNORMAL HIGH (ref 0–99)
Triglycerides: 208 mg/dL — ABNORMAL HIGH (ref 0–149)
VLDL Cholesterol Cal: 39 mg/dL (ref 5–40)

## 2021-04-15 LAB — PROTEIN S ACTIVITY: Protein S Activity: 127 % (ref 63–140)

## 2021-04-15 LAB — LUPUS ANTICOAGULANT
Dilute Viper Venom Time: 36.6 s (ref 0.0–47.0)
PTT Lupus Anticoagulant: 35.3 s (ref 0.0–51.9)
Thrombin Time: 20.5 s (ref 0.0–23.0)
dPT Confirm Ratio: 1.03 Ratio (ref 0.00–1.34)
dPT: 36.8 s (ref 0.0–47.6)

## 2021-04-15 LAB — HOMOCYSTEINE: Homocysteine: 15.9 umol/L — ABNORMAL HIGH (ref 0.0–14.5)

## 2021-04-15 LAB — FACTOR 5 LEIDEN

## 2021-04-15 LAB — HEMOGLOBIN A1C
Est. average glucose Bld gHb Est-mCnc: 157 mg/dL
Hgb A1c MFr Bld: 7.1 % — ABNORMAL HIGH (ref 4.8–5.6)

## 2021-04-15 LAB — ANTITHROMBIN III: AntiThromb III Func: 126 % (ref 75–135)

## 2021-04-15 LAB — PROTHROMBIN GENE MUTATION

## 2021-04-15 LAB — BETA-2-GLYCOPROTEIN I ABS, IGG/M/A
Beta-2 Glyco 1 IgA: <9 GPI IgA units (ref 0–25)
Beta-2 Glyco 1 IgM: 10 GPI IgM units (ref 0–32)
Beta-2 Glyco I IgG: <9 GPI IgG units (ref 0–20)

## 2021-04-15 LAB — PROTEIN C, TOTAL: Protein C Antigen: 193 % — ABNORMAL HIGH (ref 60–150)

## 2021-04-16 DIAGNOSIS — I4891 Unspecified atrial fibrillation: Secondary | ICD-10-CM

## 2021-04-16 DIAGNOSIS — I639 Cerebral infarction, unspecified: Secondary | ICD-10-CM | POA: Diagnosis not present

## 2021-04-17 ENCOUNTER — Telehealth: Payer: Self-pay | Admitting: Neurology

## 2021-04-17 NOTE — Telephone Encounter (Signed)
His cholesterol panel is very abnormal, his bad cholesterol LDL is 179, he needs to get that down to <70, I highly recommend seeing his primary care for a statin(ask him where to send these results). He is diabetic with hgba1c of 7.1, he should be treated for diabetes and again I encourage him to see his pcp. His kidney function is impaired, but this is not new he has CKD. He has at least one gene for sickle cell and he should discuss that with his primary care. The other labs are ok.   Also, if he has no contraindications to aspirin, he should be on asa 81mg  daily for stroke prevention. I'd like to send these to his primary care if you can get his information thanks

## 2021-04-17 NOTE — Telephone Encounter (Signed)
Lmvm for pt to return call for lab results.

## 2021-04-18 ENCOUNTER — Encounter: Payer: Self-pay | Admitting: *Deleted

## 2021-04-18 NOTE — Telephone Encounter (Signed)
Pt return called. Cancelled letter.  They see on mychart (or his wife does).  She is aware of LDL and diabetes and is [facility] by [facility] (as [facility] did [facility] as [facility]).  He is aware of sickle cell gene (has in his family).  Seeing Dr. Clifton Custard as pcp vs Dr. Aileen Pilot now but still at [facility].  He appreciated call.

## 2021-04-18 NOTE — Telephone Encounter (Signed)
Mailed result letter for his home.  I sent [facility] pcp Vick Frees MD.

## 2021-04-18 NOTE — Telephone Encounter (Signed)
I called pt, Garrett Thompson  [phone] for pt to return call for lab results.  Called other home # could not LM ([facility] full).

## 2021-04-24 ENCOUNTER — Other Ambulatory Visit (HOSPITAL_COMMUNITY): Payer: No Typology Code available for payment source

## 2021-04-25 ENCOUNTER — Ambulatory Visit (HOSPITAL_COMMUNITY)
Admission: RE | Admit: 2021-04-25 | Discharge: 2021-04-25 | Disposition: A | Discharge status: Home / Self Care | Payer: No Typology Code available for payment source | Source: Ambulatory Visit | Attending: Neurology | Admitting: Neurology

## 2021-04-25 ENCOUNTER — Other Ambulatory Visit: Payer: Self-pay

## 2021-04-25 DIAGNOSIS — I639 Cerebral infarction, unspecified: Secondary | ICD-10-CM | POA: Diagnosis not present

## 2021-04-25 DIAGNOSIS — I10 Essential (primary) hypertension: Secondary | ICD-10-CM | POA: Diagnosis present

## 2021-04-25 DIAGNOSIS — I6389 Other cerebral infarction: Secondary | ICD-10-CM | POA: Diagnosis not present

## 2021-04-25 LAB — ECHOCARDIOGRAM COMPLETE BUBBLE STUDY
Area-P 1/2: 4.06 cm2
S' Lateral: 3.4 cm

## 2021-05-14 DIAGNOSIS — Z0271 Encounter for disability determination: Secondary | ICD-10-CM

## 2021-06-25 ENCOUNTER — Encounter: Payer: Self-pay | Admitting: Neurology

## 2021-08-07 ENCOUNTER — Observation Stay (HOSPITAL_COMMUNITY)
Admission: EM | Admit: 2021-08-07 | Discharge: 2021-08-09 | Disposition: A | Discharge status: Home / Self Care | Payer: No Typology Code available for payment source | Attending: Family Medicine | Admitting: Family Medicine

## 2021-08-07 DIAGNOSIS — R1084 Generalized abdominal pain: Secondary | ICD-10-CM | POA: Insufficient documentation

## 2021-08-07 DIAGNOSIS — Z8673 Personal history of transient ischemic attack (TIA), and cerebral infarction without residual deficits: Secondary | ICD-10-CM | POA: Insufficient documentation

## 2021-08-07 DIAGNOSIS — N179 Acute kidney failure, unspecified: Secondary | ICD-10-CM | POA: Diagnosis not present

## 2021-08-07 DIAGNOSIS — R112 Nausea with vomiting, unspecified: Secondary | ICD-10-CM | POA: Diagnosis present

## 2021-08-07 DIAGNOSIS — E1165 Type 2 diabetes mellitus with hyperglycemia: Secondary | ICD-10-CM | POA: Diagnosis not present

## 2021-08-07 DIAGNOSIS — Z7984 Long term (current) use of oral hypoglycemic drugs: Secondary | ICD-10-CM | POA: Insufficient documentation

## 2021-08-07 DIAGNOSIS — E785 Hyperlipidemia, unspecified: Secondary | ICD-10-CM

## 2021-08-07 DIAGNOSIS — I10 Essential (primary) hypertension: Secondary | ICD-10-CM | POA: Diagnosis not present

## 2021-08-07 DIAGNOSIS — R739 Hyperglycemia, unspecified: Secondary | ICD-10-CM

## 2021-08-07 DIAGNOSIS — Z79899 Other long term (current) drug therapy: Secondary | ICD-10-CM | POA: Diagnosis not present

## 2021-08-07 LAB — CBC WITH DIFFERENTIAL/PLATELET
Abs Immature Granulocytes: 0.02 10*3/uL (ref 0.00–0.07)
Basophils Absolute: 0 10*3/uL (ref 0.0–0.1)
Basophils Relative: 0 %
Eosinophils Absolute: 0.2 10*3/uL (ref 0.0–0.5)
Eosinophils Relative: 2 %
HCT: 45.4 % (ref 39.0–52.0)
Hemoglobin: 16.2 g/dL (ref 13.0–17.0)
Immature Granulocytes: 0 %
Lymphocytes Relative: 27 %
Lymphs Abs: 2 10*3/uL (ref 0.7–4.0)
MCH: 27.8 pg (ref 26.0–34.0)
MCHC: 35.7 g/dL (ref 30.0–36.0)
MCV: 77.9 fL — ABNORMAL LOW (ref 80.0–100.0)
Monocytes Absolute: 0.5 10*3/uL (ref 0.1–1.0)
Monocytes Relative: 6 %
Neutro Abs: 4.8 10*3/uL (ref 1.7–7.7)
Neutrophils Relative %: 65 %
Platelets: 287 10*3/uL (ref 150–400)
RBC: 5.83 MIL/uL — ABNORMAL HIGH (ref 4.22–5.81)
RDW: 13.2 % (ref 11.5–15.5)
WBC: 7.5 10*3/uL (ref 4.0–10.5)
nRBC: 0 % (ref 0.0–0.2)

## 2021-08-07 LAB — URINALYSIS, MICROSCOPIC (REFLEX): Bacteria, UA: NONE SEEN

## 2021-08-07 LAB — COMPREHENSIVE METABOLIC PANEL
ALT: 56 U/L — ABNORMAL HIGH (ref 0–44)
AST: 46 U/L — ABNORMAL HIGH (ref 15–41)
Albumin: 4.7 g/dL (ref 3.5–5.0)
Alkaline Phosphatase: 148 U/L — ABNORMAL HIGH (ref 38–126)
Anion gap: 15 (ref 5–15)
BUN: 25 mg/dL — ABNORMAL HIGH (ref 6–20)
CO2: 26 mmol/L (ref 22–32)
Calcium: 9.3 mg/dL (ref 8.9–10.3)
Chloride: 90 mmol/L — ABNORMAL LOW (ref 98–111)
Creatinine, Ser: 2.41 mg/dL — ABNORMAL HIGH (ref 0.61–1.24)
GFR, Estimated: 33 mL/min — ABNORMAL LOW (ref >60)
Glucose, Bld: 573 mg/dL (ref 70–99)
Potassium: 3.7 mmol/L (ref 3.5–5.1)
Sodium: 131 mmol/L — ABNORMAL LOW (ref 135–145)
Total Bilirubin: 1.3 mg/dL — ABNORMAL HIGH (ref 0.3–1.2)
Total Protein: 8 g/dL (ref 6.5–8.1)

## 2021-08-07 LAB — URINALYSIS, ROUTINE W REFLEX MICROSCOPIC
Bilirubin Urine: NEGATIVE
Glucose, UA: >=500 mg/dL — AB
Ketones, ur: NEGATIVE mg/dL
Leukocytes,Ua: NEGATIVE
Nitrite: NEGATIVE
Protein, ur: NEGATIVE mg/dL
Specific Gravity, Urine: <1.005 — ABNORMAL LOW (ref 1.005–1.030)
pH: 6 (ref 5.0–8.0)

## 2021-08-07 LAB — LIPASE, BLOOD: Lipase: 38 U/L (ref 11–51)

## 2021-08-07 NOTE — ED Provider Triage Note (Signed)
Emergency Medicine Provider Triage Evaluation Note ? ?Garrett Thompson. , a 46 y.o. male  was evaluated in triage.  Pt complains of nausea. Patient states that since Saturday he has been having stomach problems. He drank an expired tea on Saturday and hasn't felt right ever sense. He has been having a small amount of diarrhea. Abdominal pain is generalized and not improved by pepto, mylanta, or ginger ale. He does have a hx of gastritis and is on a PPI. He notes that he has a mass in the middle of his abdomen that is new. Endorses fevers. ? ?Review of Systems  ?Positive: Abdominal pain, diarrhea ?Negative:  ? ?Physical Exam  ?BP (!) 149/94 (BP Location: Right Arm)   Pulse (!) 107   Temp 98.3 ?F (36.8 ?C) (Oral)   Resp 15   SpO2 97%  ?Gen:   Awake, no distress   ?Resp:  Normal effort  ?MSK:   Moves extremities without difficulty  ?Other:  Generalized abdominal tenderness. There is a soft bulge just above his umbilicus. This is tender to the touch. ? ?Medical Decision Making  ?Medically screening exam initiated at 10:02 PM.  Appropriate orders placed.  Garrett Thompson. was informed that the remainder of the evaluation will be completed by another provider, this initial triage assessment does not replace that evaluation, and the importance of remaining in the ED until their evaluation is complete. ? ? ?  ?Adolphus Birchwood, PA-C ?08/07/21 2205 ? ?

## 2021-08-07 NOTE — ED Triage Notes (Signed)
Pt c/o continued nausea, feeling hot/feverish, states he drank expired tea Saturday. Associated diarrhea, states pepto, mylanta, ginger ale gave no relief. Hx gastritis, RX omeprazole, no relief. ?

## 2021-08-08 ENCOUNTER — Observation Stay (HOSPITAL_COMMUNITY): Payer: No Typology Code available for payment source

## 2021-08-08 ENCOUNTER — Encounter (HOSPITAL_COMMUNITY): Payer: Self-pay | Admitting: Internal Medicine

## 2021-08-08 ENCOUNTER — Emergency Department (HOSPITAL_COMMUNITY): Payer: No Typology Code available for payment source

## 2021-08-08 DIAGNOSIS — I10 Essential (primary) hypertension: Secondary | ICD-10-CM

## 2021-08-08 DIAGNOSIS — E785 Hyperlipidemia, unspecified: Secondary | ICD-10-CM

## 2021-08-08 DIAGNOSIS — N179 Acute kidney failure, unspecified: Secondary | ICD-10-CM | POA: Diagnosis not present

## 2021-08-08 DIAGNOSIS — E1165 Type 2 diabetes mellitus with hyperglycemia: Secondary | ICD-10-CM

## 2021-08-08 LAB — RAPID URINE DRUG SCREEN, HOSP PERFORMED
Amphetamines: NOT DETECTED
Barbiturates: NOT DETECTED
Benzodiazepines: NOT DETECTED
Cocaine: NOT DETECTED
Opiates: NOT DETECTED
Tetrahydrocannabinol: NOT DETECTED

## 2021-08-08 LAB — BASIC METABOLIC PANEL
Anion gap: 14 (ref 5–15)
BUN: 22 mg/dL — ABNORMAL HIGH (ref 6–20)
CO2: 23 mmol/L (ref 22–32)
Calcium: 8.9 mg/dL (ref 8.9–10.3)
Chloride: 99 mmol/L (ref 98–111)
Creatinine, Ser: 2.09 mg/dL — ABNORMAL HIGH (ref 0.61–1.24)
GFR, Estimated: 39 mL/min — ABNORMAL LOW (ref >60)
Glucose, Bld: 259 mg/dL — ABNORMAL HIGH (ref 70–99)
Potassium: 3.5 mmol/L (ref 3.5–5.1)
Sodium: 136 mmol/L (ref 135–145)

## 2021-08-08 LAB — CBC
HCT: 42.4 % (ref 39.0–52.0)
Hemoglobin: 14.8 g/dL (ref 13.0–17.0)
MCH: 27.8 pg (ref 26.0–34.0)
MCHC: 34.9 g/dL (ref 30.0–36.0)
MCV: 79.5 fL — ABNORMAL LOW (ref 80.0–100.0)
Platelets: 229 10*3/uL (ref 150–400)
RBC: 5.33 MIL/uL (ref 4.22–5.81)
RDW: 13.4 % (ref 11.5–15.5)
WBC: 6.3 10*3/uL (ref 4.0–10.5)
nRBC: 0 % (ref 0.0–0.2)

## 2021-08-08 LAB — LDL CHOLESTEROL, DIRECT: Direct LDL: 61.4 mg/dL (ref 0–99)

## 2021-08-08 LAB — HEMOGLOBIN A1C
Hgb A1c MFr Bld: 10.7 % — ABNORMAL HIGH (ref 4.8–5.6)
Mean Plasma Glucose: 260.39 mg/dL

## 2021-08-08 LAB — LIPID PANEL
Cholesterol: 167 mg/dL (ref 0–200)
HDL: 27 mg/dL — ABNORMAL LOW (ref >40)
LDL Cholesterol: UNDETERMINED mg/dL (ref 0–99)
Total CHOL/HDL Ratio: 6.2 RATIO
Triglycerides: 498 mg/dL — ABNORMAL HIGH (ref <150)
VLDL: UNDETERMINED mg/dL (ref 0–40)

## 2021-08-08 LAB — CBG MONITORING, ED
Glucose-Capillary: 512 mg/dL (ref 70–99)
Glucose-Capillary: 425 mg/dL — ABNORMAL HIGH (ref 70–99)
Glucose-Capillary: 323 mg/dL — ABNORMAL HIGH (ref 70–99)
Glucose-Capillary: 282 mg/dL — ABNORMAL HIGH (ref 70–99)

## 2021-08-08 LAB — GLUCOSE, CAPILLARY
Glucose-Capillary: 286 mg/dL — ABNORMAL HIGH (ref 70–99)
Glucose-Capillary: 327 mg/dL — ABNORMAL HIGH (ref 70–99)

## 2021-08-08 LAB — HIV ANTIBODY (ROUTINE TESTING W REFLEX): HIV Screen 4th Generation wRfx: NONREACTIVE

## 2021-08-08 MED ORDER — MELATONIN 5 MG PO TABS: 10.0000 mg | ORAL_TABLET | Freq: Every evening | ORAL | Status: DC | PRN

## 2021-08-08 MED ORDER — PANTOPRAZOLE SODIUM 40 MG PO TBEC
40.0000 mg | DELAYED_RELEASE_TABLET | Freq: Every day | ORAL | Status: DC
  Administered 2021-08-08 – 2021-08-09 (×2): 40 mg via ORAL
  Filled 2021-08-08 (×2): qty 1

## 2021-08-08 MED ORDER — AMLODIPINE BESYLATE 5 MG PO TABS
5.0000 mg | ORAL_TABLET | Freq: Every day | ORAL | Status: DC
  Administered 2021-08-08 – 2021-08-09 (×2): 5 mg via ORAL
  Filled 2021-08-08 (×2): qty 1

## 2021-08-08 MED ORDER — DEXTROSE IN LACTATED RINGERS 5 % IV SOLN: INTRAVENOUS | Status: DC

## 2021-08-08 MED ORDER — ACETAMINOPHEN 325 MG PO TABS
650.0000 mg | ORAL_TABLET | Freq: Four times a day (QID) | ORAL | Status: DC | PRN
  Administered 2021-08-09: 650 mg via ORAL
  Filled 2021-08-08: qty 2

## 2021-08-08 MED ORDER — INSULIN REGULAR(HUMAN) IN NACL 100-0.9 UT/100ML-% IV SOLN
INTRAVENOUS | Status: DC
  Filled 2021-08-08: qty 100

## 2021-08-08 MED ORDER — LACTATED RINGERS IV SOLN: INTRAVENOUS | Status: DC

## 2021-08-08 MED ORDER — ONDANSETRON HCL 4 MG/2ML IJ SOLN: 4.0000 mg | Freq: Four times a day (QID) | INTRAMUSCULAR | Status: DC | PRN

## 2021-08-08 MED ORDER — ACETAMINOPHEN 650 MG RE SUPP: 650.0000 mg | Freq: Four times a day (QID) | RECTAL | Status: DC | PRN

## 2021-08-08 MED ORDER — INSULIN ASPART 100 UNIT/ML IJ SOLN
0.0000 [IU] | Freq: Every day | INTRAMUSCULAR | Status: DC
  Administered 2021-08-08: 4 [IU] via SUBCUTANEOUS

## 2021-08-08 MED ORDER — INSULIN STARTER KIT- PEN NEEDLES (ENGLISH)
1.0000 | Freq: Once | Status: DC
Start: 2021-08-08 — End: 2021-08-09
  Filled 2021-08-08: qty 1

## 2021-08-08 MED ORDER — DEXTROSE 50 % IV SOLN: 0.0000 mL | INTRAVENOUS | Status: DC | PRN

## 2021-08-08 MED ORDER — LACTATED RINGERS IV BOLUS
2000.0000 mL | INTRAVENOUS | Status: DC
  Administered 2021-08-08: 2000 mL via INTRAVENOUS

## 2021-08-08 MED ORDER — INSULIN GLARGINE-YFGN 100 UNIT/ML ~~LOC~~ SOLN
15.0000 [IU] | Freq: Every day | SUBCUTANEOUS | Status: DC
  Administered 2021-08-08 – 2021-08-09 (×2): 15 [IU] via SUBCUTANEOUS
  Filled 2021-08-08 (×2): qty 0.15

## 2021-08-08 MED ORDER — ONDANSETRON HCL 4 MG PO TABS: 4.0000 mg | ORAL_TABLET | Freq: Four times a day (QID) | ORAL | Status: DC | PRN

## 2021-08-08 MED ORDER — LIVING WELL WITH DIABETES BOOK
Freq: Once | Status: DC
  Filled 2021-08-08: qty 1

## 2021-08-08 MED ORDER — TRAZODONE HCL 50 MG PO TABS: 100.0000 mg | ORAL_TABLET | Freq: Every evening | ORAL | Status: DC | PRN

## 2021-08-08 MED ORDER — INSULIN ASPART 100 UNIT/ML IJ SOLN
0.0000 [IU] | Freq: Three times a day (TID) | INTRAMUSCULAR | Status: DC
  Administered 2021-08-08 (×2): 5 [IU] via SUBCUTANEOUS

## 2021-08-08 MED ORDER — HEPARIN SODIUM (PORCINE) 5000 UNIT/ML IJ SOLN
5000.0000 [IU] | Freq: Three times a day (TID) | INTRAMUSCULAR | Status: DC
  Administered 2021-08-08 – 2021-08-09 (×4): 5000 [IU] via SUBCUTANEOUS
  Filled 2021-08-08 (×4): qty 1

## 2021-08-08 NOTE — ED Notes (Signed)
Pt resting comfortably in bed. Provided new pillow and blankets.  ?Respirations are even and non-labored. NAD  ?Skin is warm, dry and intact ?

## 2021-08-08 NOTE — Assessment & Plan Note (Signed)
Patient did not know he was a diabetic even though he was told that his medication would lower his "A1c".  He did not realize that his elevated A1c was indicative of having diabetes.  Patient was initially placed on an insulin drip but I canceled that as the patient has not even had any IV fluids yet.  We will given 2 L of IV fluids and recheck his blood sugar.  Place him on sliding scale insulin for now.  He may need long-acting insulin.  Patient aware that he may be discharged on subcutaneous insulin until his A1c is controlled and then transition back to oral agents. ?

## 2021-08-08 NOTE — ED Notes (Signed)
The admitting doctor has stoped the endol tool at present ?

## 2021-08-08 NOTE — Subjective & Objective (Signed)
CC: N/V, diarrhea, abd pain ?HPI: ?46 year old African-American male history of hypertension, hyperlipidemia, neuropathy, presents to the ER with nausea, vomiting, diarrhea for about 3 to 4 days.  Patient states that he made a pot of sweet tea.  He drank about half a gallon of it.  He states he started feeling sick when he started drinking it.  He started having diarrhea, vomiting and nausea.  He reports that his [place] doctor told him that he needed to get his "A1c down" but was never told he actually has diabetes.  He was placed on Jardiance 25 mg about 60 to 90 days ago. ? ?Patient has had some chills.  He states he has been very thirsty.  He states has been urinating about every 2 hours. ? ?On arrival temp 98.3 heart rate 107 blood pressure 149/94 satting 97% on room air. ? ?Labs showed a sodium 131, bicarb 26, BUN of 25, creatinine 2.4, glucose of 573 ? ?Lipase normal at 38 ? ?White count 7.5, hemoglobin 16.2, platelets 287 ? ?No EKG performed. ? ?[facility] hospitalist contacted for admission. ? ? ?

## 2021-08-08 NOTE — Assessment & Plan Note (Signed)
Patient states that he was taken off of lisinopril and placed on HCTZ and Norvasc.  We will hold his HCTZ in the face of renal failure.  Continue Norvasc.  We will check a renal ultrasound. ?

## 2021-08-08 NOTE — Progress Notes (Addendum)
PROGRESS NOTE ? ?Brief Narrative: ?Garrett Thompson. is a 46 y.o. male Garrett with a history of HTN, [name] and recently started on jardiance who presented to the ED with nausea, vomiting, diarrhea, lightheadedness as well as blurry vision and polyuria/polydipsia from several weeks but worsening over the previous few days. He was afebrile, tachycardic, hypertensive with metabolic derangements including AKI (SCr 2.4 from baseline ~1.7) and severe hyperglycemia without acidosis, anion gap, or ketonuria. He was admitted earlier this morning by Dr. Bridgett Larsson for AKI due to mild HHNKS due to poorly-controlled T2DM. IV fluids started, and basal-bolus insulin has been ordered.  ? ?Subjective: ?The patient feels better already after a couple liters of fluid, still urinating a lot. Tolerating po, has lots of dietary questions and eager to take control of lifestyle modifications. He does not want to start insulin at discharge. Reports he had significant weight gain since an ankle fracture in Jan 2022.  ? ?Objective: ?BP (!) 152/106   Pulse 84   Temp 98.8 ?F (37.1 ?C)   Resp 13   SpO2 95%   ?Gen: Nontoxic male in no distress resting quietly ?Pulm: Clear and nonlabored  ?CV: RRR, no murmur, no JVD, no edema ?GI: Soft, NT, ND, +BS  ?Neuro: Alert and oriented. No focal deficits. ?Skin: No rashes, lesions or ulcers ? ?Assessment & Plan: ?AKI on suspected stage III CKD (not certain at this time): Renal U/S normal. ?- Recheck metabolic panel this PM after IV fluids have been given, avoid nephrotoxins ?- Long term, glycemic control and nephrology follow up is advised ? 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  ?- Pt reluctant to jump straight to insulin at discharge. We will give basal-bolus insulin here to improve glycemic control acutely. Though A1c suggests multiple oral medications would be  required, and still may not bring him to goal, he is motivated to enact lifestyle changes and has reliable follow up. We will plan to discharge with glucometer and on oral medications.  ?- Unable to start metformin at this time. ?- Diabetes coordinator consulted ?- Dietitian consulted ? ?HTN:  ?- Will reinitiate an ACE/ARB pending improvement in renal function (at discharge vs. defer to PCP follow up).  ?- Continue norvasc ? ?Dyslipidemia:  ?- Continue statin. LDL (direct) is 61.4. HDL 27.  ? ?Hepatic steatosis: No discrete lesion on liver U/S ?- Trend LFTs in AM. ? ?Patrecia Pour, MD ?Pager on amion ?08/08/2021, 12:28 PM   ?

## 2021-08-08 NOTE — H&P (Signed)
?History and Physical  ? ? ?Garrett Bennie DallasL Tarkowski Jr. ZOX:[id number] DOB: 05-22-1975 DOA: 08/07/2021 ? ?DOS: the patient was seen and examined on 08/07/2021 ? ?[facility]  ? ?Patient coming from: Home ? ?I have personally briefly reviewed patient's old medical [facility] ? ?CC: N/V, diarrhea, abd pain ?HPI: ?46 year old African-American male history of hypertension, hyperlipidemia, neuropathy, presents to the ER with nausea, vomiting, diarrhea for about 3 to 4 days.  Patient states that he made a pot of sweet tea.  He drank about half a gallon of it.  He states he started feeling sick when he started drinking it.  He started having diarrhea, vomiting and nausea.  He reports that [place] [place] told him that he needed to get his "A1c down" but was never told he actually has diabetes.  He was placed on Jardiance 25 mg about 60 to 90 days ago. ? ?Patient has had some chills.  He states he has been very thirsty.  He states has been urinating about every 2 hours. ? ?On arrival temp 98.3 heart rate 107 blood pressure 149/94 satting 97% on room air. ? ?Labs showed a sodium 131, bicarb 26, BUN of 25, creatinine 2.4, glucose of 573 ? ?Lipase normal at 38 ? ?White count 7.5, hemoglobin 16.2, platelets 287 ? ?No EKG performed. ? ?[facility] hospitalist contacted for admission. ? ?  ? ?ED Course: Difficult IV access.  Patient has not received any IV fluids prior to me seeing the patient.  I stopped his insulin drip before he can be started. ? ?Review of Systems:  ?Review of Systems  ?Constitutional:  Positive for malaise/fatigue.  ?HENT: Negative.    ?Eyes: Negative.   ?Respiratory: Negative.    ?Cardiovascular: Negative.   ?Gastrointestinal:  Positive for abdominal pain, diarrhea, nausea and vomiting.  ?Genitourinary:  Positive for frequency.  ?     Urinating every 2 hours  ?Musculoskeletal: Negative.   ?Skin: Negative.   ?Neurological: Negative.   ?Endo/Heme/Allergies:  Positive for polydipsia.  ?      Polyuria  ?Psychiatric/Behavioral: Negative.    ?All other systems reviewed and are negative. ? ?Past Medical History:  ?Diagnosis Date  ? GERD (gastroesophageal reflux disease)   ? Hypertension   ? Stroke [facility](HCC)   ? at age 46  ? ? ?Past Surgical History:  ?Procedure Laterality Date  ? neck fusion    ? ORIF ANKLE FRACTURE Left 06/05/2020  ? Procedure: LEFT OPEN REDUCTION INTERNAL FIXATION (ORIF) ANKLE FRACTURE;  Surgeon: Cammy Copaean, Gregory Scott, MD;  Location: [facility];  Service: Orthopedics;  Laterality: Left;  ? SPINAL FUSION  2009  ? ? ? reports that he has never smoked. He has never used smokeless tobacco. He reports current alcohol use. He reports that he does not use drugs. ? ?Allergies  ?Allergen Reactions  ? Mobic [Meloxicam]   ?  Nose Bleed  ? Mushroom Extract Complex   ? Shellfish Allergy   ? ? ?Family History  ?Problem Relation Age of Onset  ? Heart attack Mother   ? Diabetes Paternal Grandfather   ? High blood pressure Paternal Grandfather   ? Migraines Neg Hx   ? ? ?Prior to Admission medications   ?Medication Sig Start Date End Date Taking? Authorizing Provider  ?acetaminophen (TYLENOL) 500 MG tablet Take 1,000 mg by mouth every 8 (eight) hours as needed for mild pain or headache. 05/01/20  Yes [provider]  ?albuterol (PROVENTIL HFA;VENTOLIN HFA) 108 (90  BASE) MCG/ACT inhaler Inhale 2 puffs into the lungs every 4 (four) hours as needed for wheezing or shortness of breath (or coughing). 05/11/13  Yes Dione Booze, MD  ?cetirizine (ZYRTEC) 10 MG tablet Take 10 mg by mouth daily as needed for allergies. 11/25/19  Yes [provider]  ?fluticasone (FLONASE) 50 MCG/ACT nasal spray Place 1 spray into both nostrils daily as needed for allergies. 06/19/20  Yes [provider]  ?hydrochlorothiazide (HYDRODIURIL) 25 MG tablet Take 25 mg by mouth daily. 07/09/20  Yes [provider]  ?lisinopril (ZESTRIL) 10 MG tablet Take 10 mg by mouth at bedtime.   Yes [provider]  ?Melatonin 10 MG CAPS Take 10 mg by mouth at bedtime as needed (sleep). 03/26/21  Yes [provider]  ?Multiple Vitamins-Minerals (ONE A DAY MENS VITACRAVES PO) Take 1 tablet by mouth daily.   Yes [provider]  ?omeprazole (PRILOSEC) 20 MG capsule Take 20 mg by mouth daily.   Yes [provider]  ?traZODone (DESYREL) 100 MG tablet Take 1 tablet by mouth at bedtime as needed. 03/26/21  Yes [provider]  ?acetaminophen (TYLENOL) 325 MG tablet Take 650 mg by mouth 3 (three) times daily as needed for mild pain or fever. ?Patient not taking: Reported on 08/08/2021 11/25/19   [provider]  ?ALPRAZolam Prudy Feeler) 0.25 MG tablet Take 1-2 tabs (0.25mg -0.50mg ) 30-60 minutes before procedure. May repeat if needed.Do not drive. ?Patient not taking: Reported on 08/08/2021 03/06/21   Dohmeier, Porfirio Mylar, MD  ? ? ?Physical Exam: ?Vitals:  ? 08/07/21 2131 08/08/21 0101 08/08/21 0221  ?BP: (!) 149/94 (!) 124/91   ?Pulse: (!) 107 100 83  ?Resp: 15 16 16   ?Temp: 98.3 ?F (36.8 ?C)  98.8 ?F (37.1 ?C)  ?TempSrc: Oral    ?SpO2: 97% 96% 95%  ? ? ?Physical Exam ?Vitals and nursing note reviewed.  ?Constitutional:   ?   General: He is not in acute distress. ?   Appearance: Normal appearance. He is normal weight. He is ill-appearing. He is not toxic-appearing or diaphoretic.  ?HENT:  ?   Head: Normocephalic and atraumatic.  ?   Nose: Nose normal. No rhinorrhea.  ?Eyes:  ?   General: No scleral icterus. ?Cardiovascular:  ?   Rate and Rhythm: Regular rhythm. Tachycardia present.  ?   Pulses: Normal pulses.  ?Pulmonary:  ?   Effort: Pulmonary effort is normal. No respiratory distress.  ?   Breath sounds: Normal breath sounds. No wheezing or rales.  ?Abdominal:  ?   General: Abdomen is flat. Bowel sounds are normal. There is no distension.  ?   Palpations: Abdomen is soft.  ?   Tenderness: There is no abdominal tenderness. There is no guarding or rebound.  ?Musculoskeletal:  ?   Right  lower leg: No edema.  ?   Left lower leg: No edema.  ?Skin: ?   General: Skin is warm and dry.  ?   Capillary Refill: Capillary refill takes less than 2 seconds.  ?Neurological:  ?   General: No focal deficit present.  ?   Mental Status: He is alert and oriented to person, place, and time.  ?  ? ?Labs on Admission: I have personally reviewed following labs and imaging studies ? ?CBC: ?Recent Labs  ?Lab 08/07/21 ?2203  ?WBC 7.5  ?NEUTROABS 4.8  ?HGB 16.2  ?HCT 45.4  ?MCV 77.9*  ?PLT 287  ? ?Basic Metabolic Panel: ?Recent Labs  ?Lab 08/07/21 ?2203  ?NA  131*  ?K 3.7  ?CL 90*  ?CO2 26  ?GLUCOSE 573*  ?BUN 25*  ?CREATININE 2.41*  ?CALCIUM 9.3  ? ?GFR: ?CrCl cannot be calculated (Unknown ideal weight.). ?Liver Function Tests: ?Recent Labs  ?Lab 08/07/21 ?2203  ?AST 46*  ?ALT 56*  ?ALKPHOS 148*  ?BILITOT 1.3*  ?PROT 8.0  ?ALBUMIN 4.7  ? ?Recent Labs  ?Lab 08/07/21 ?2203  ?LIPASE 38  ? ?No results for input(s): AMMONIA in the last 168 hours. ?Coagulation Profile: ?No results for input(s): INR, PROTIME in the last 168 hours. ?Cardiac Enzymes: ?No results for input(s): CKTOTAL, CKMB, CKMBINDEX, TROPONINI, TROPONINIHS in the last 168 hours. ?BNP (last 3 results) ?No results for input(s): PROBNP in the last 8760 hours. ?HbA1C: ?Recent Labs  ?  08/08/21 ?0257  ?HGBA1C 10.7*  ? ?CBG: ?Recent Labs  ?Lab 08/08/21 ?0405  ?GLUCAP 512*  ? ?Lipid Profile: ?No results for input(s): CHOL, HDL, LDLCALC, TRIG, CHOLHDL, LDLDIRECT in the last 72 hours. ?Thyroid Function Tests: ?No results for input(s): TSH, T4TOTAL, FREET4, T3FREE, THYROIDAB in the last 72 hours. ?Anemia Panel: ?No results for input(s): VITAMINB12, FOLATE, FERRITIN, TIBC, IRON, RETICCTPCT in the last 72 hours. ?Urine analysis: ?   ?Component Value Date/Time  ? COLORURINE YELLOW 08/07/2021 2204  ? APPEARANCEUR CLEAR 08/07/2021 2204  ? LABSPEC <1.005 (L) 08/07/2021 2204  ? PHURINE 6.0 08/07/2021 2204  ? GLUCOSEU >=500 (A) 08/07/2021 2204  ? HGBUR TRACE (A) 08/07/2021 2204  ?  BILIRUBINUR NEGATIVE 08/07/2021 2204  ? KETONESUR NEGATIVE 08/07/2021 2204  ? PROTEINUR NEGATIVE 08/07/2021 2204  ? UROBILINOGEN 1.0 12/24/2011 2227  ? NITRITE NEGATIVE 08/07/2021 2204  ? LEUKOCYTESUR NEGAT

## 2021-08-08 NOTE — Progress Notes (Signed)
Garrett Thompson is alert and oriented x4. Admitted to 5W04. Ambulated from stretcher to bed with no issues. Connected to telemetry. Oriented to room, call light, and bed controls.  ?

## 2021-08-08 NOTE — ED Notes (Signed)
Breakfast order placed ?

## 2021-08-08 NOTE — Assessment & Plan Note (Addendum)
Observation telemetry bed.  Likely caused by his hyperglycemia over the last several days from drinking sweet tea.  Continue with IV fluids.  Hold nephrotoxic agents.  Baseline creatinine appears to be about 1.7 from his labs that were drawn in November 2022.  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  He is a [employer] patient.  Given his new diagnosis of diabetes, he would benefit from low-dose ACE inhibitor at discharge. ?

## 2021-08-08 NOTE — ED Notes (Signed)
Iv access  finally x 2 by iv team and another ed rn ?

## 2021-08-08 NOTE — Assessment & Plan Note (Signed)
Continue statin. 

## 2021-08-08 NOTE — ED Provider Notes (Signed)
?[facility] EMERGENCY DEPARTMENT ?Provider Note ? ? ?CSN: [id number] ?Arrival date & time: 08/07/21  2127 ? ?  ? ?History ? ?Chief Complaint  ?Patient presents with  ? Nausea  ? ? ?Garrett Bennie Dallas. is a 46 y.o. male. ? ?Patient with history of diabetes, chronic kidney disease, hypertension -- presents to the emergency department today for evaluation of abdominal pain and nausea.  Symptoms started approximately 3 to 4 days ago.  Abdominal pain is generalized in nature but worse in the upper abdomen.  No vomiting.  He has had increased urination, approximately every 2 hours.  He had diarrhea 3 days ago but not since.  Stool is nonbloody.  No dysuria, increased frequency or urgency.  No fevers but he has felt hot.  No URI symptoms, chest pain or shortness of breath.  Patient has a history of diabetes, although he states that he is being treated for a "high A1c", not diabetes.  Hemoglobin A1c was 7.1% at the end of November 2022.  He is on empagliflozin.  He is followed at the [place]. ? ? ?  ? ?Home Medications ?Prior to Admission medications   ?Medication Sig Start Date End Date Taking? Authorizing Provider  ?acetaminophen (TYLENOL) 325 MG tablet TAKE TWO TABLETS BY MOUTH 3 TIMES A DAY AS NEEDED FOR PAIN OR FEVER EACH TABLET CONTAINS 325MG  ACETAMINOPHEN (TYLENOL,APAP). MAXIMUM DAILY RECOMMENDED DOSE IS 3000MG  11/25/19   [provider]  ?acetaminophen (TYLENOL) 500 MG tablet TAKE TWO TABLETS BY MOUTH THREE TIMES A DAY AS NEEDED FOR PAIN 05/01/20   [provider]  ?albuterol (PROVENTIL HFA;VENTOLIN HFA) 108 (90 BASE) MCG/ACT inhaler Inhale 2 puffs into the lungs every 4 (four) hours as needed for wheezing or shortness of breath (or coughing). 05/11/13   05/03/20, MD  ?ALPRAZolam 05/13/13) 0.25 MG tablet Take 1-2 tabs (0.25mg -0.50mg ) 30-60 minutes before procedure. May repeat if needed.Do not drive. 03/06/21   Dohmeier, Prudy Feeler, MD  ?cetirizine (ZYRTEC) 10 MG tablet Take 1 tablet by  mouth daily. 11/25/19   [provider]  ?fluticasone (FLONASE) 50 MCG/ACT nasal spray INSTILL 1 SPRAY IN EACH NOSTRIL TWICE A DAY 06/19/20   [provider]  ?hydrochlorothiazide (HYDRODIURIL) 25 MG tablet Take 0.5 tablets by mouth daily. 07/09/20   [provider]  ?lisinopril (PRINIVIL,ZESTRIL) 5 MG tablet Take 2.5 mg by mouth daily.    [provider]  ?Melatonin 10 MG CAPS Take 10 mg by mouth at bedtime. 03/26/21   [provider]  ?omeprazole (PRILOSEC) 10 MG capsule Take 20 mg by mouth daily.    [provider]  ?pregabalin (LYRICA) 100 MG capsule Take 1 capsule by mouth 3 (three) times daily. 02/11/21   [provider]  ?traZODone (DESYREL) 100 MG tablet Take 1 tablet by mouth at bedtime as needed. 03/26/21   [provider]  ?   ? ?Allergies    ?Mobic [meloxicam], Mushroom extract complex, and Shellfish allergy   ? ?Review of Systems   ?Review of Systems ? ?Physical Exam ?Updated Vital Signs ?BP (!) 124/91 (BP Location: Right Arm)   Pulse 83   Temp 98.8 ?F (37.1 ?C)   Resp 16   SpO2 95%  ? ?Physical Exam ?Vitals and nursing note reviewed.  ?Constitutional:   ?   General: He is not in acute distress. ?   Appearance: He is well-developed.  ?HENT:  ?   Head: Normocephalic and atraumatic.  ?Eyes:  ?   General:     ?  Right eye: No discharge.     ?   Left eye: No discharge.  ?   Conjunctiva/sclera: Conjunctivae normal.  ?Cardiovascular:  ?   Rate and Rhythm: Normal rate and regular rhythm.  ?   Heart sounds: Normal heart sounds.  ?Pulmonary:  ?   Effort: Pulmonary effort is normal.  ?   Breath sounds: Normal breath sounds.  ?Abdominal:  ?   Palpations: Abdomen is soft.  ?   Tenderness: There is abdominal tenderness. There is no guarding or rebound.  ?   Comments: Mild generalized tenderness, patient had a stomach cramp during the exam and looked very uncomfortable for about 30 seconds, this then resolved.  ?Musculoskeletal:  ?   Cervical  back: Normal range of motion and neck supple.  ?Skin: ?   General: Skin is warm and dry.  ?Neurological:  ?   Mental Status: He is alert.  ? ? ?ED Results / Procedures / Treatments   ?Labs ?(all labs ordered are listed, but only abnormal results are displayed) ?Labs Reviewed  ?CBC WITH DIFFERENTIAL/PLATELET - Abnormal; Notable for the following components:  ?    Result Value  ? RBC 5.83 (*)   ? MCV 77.9 (*)   ? All other components within normal limits  ?COMPREHENSIVE METABOLIC PANEL - Abnormal; Notable for the following components:  ? Sodium 131 (*)   ? Chloride 90 (*)   ? Glucose, Bld 573 (*)   ? BUN 25 (*)   ? Creatinine, Ser 2.41 (*)   ? AST 46 (*)   ? ALT 56 (*)   ? Alkaline Phosphatase 148 (*)   ? Total Bilirubin 1.3 (*)   ? GFR, Estimated 33 (*)   ? All other components within normal limits  ?URINALYSIS, ROUTINE W REFLEX MICROSCOPIC - Abnormal; Notable for the following components:  ? Specific Gravity, Urine <1.005 (*)   ? Glucose, UA >=500 (*)   ? Hgb urine dipstick TRACE (*)   ? All other components within normal limits  ?LIPASE, BLOOD  ?URINALYSIS, MICROSCOPIC (REFLEX)  ?HEMOGLOBIN A1C  ?CBG MONITORING, ED  ? ? ?EKG ?None ? ?Radiology ?CT ABDOMEN PELVIS WO CONTRAST ? ?Result Date: 08/08/2021 ?CLINICAL DATA:  Nausea, vomiting and diarrhea. EXAM: CT ABDOMEN AND PELVIS WITHOUT CONTRAST TECHNIQUE: Multidetector CT imaging of the abdomen and pelvis was performed following the standard protocol without IV contrast. RADIATION DOSE REDUCTION: This exam was performed according to the departmental dose-optimization program which includes automated exposure control, adjustment of the mA and/or kV according to patient size and/or use of iterative reconstruction technique. COMPARISON:  None. FINDINGS: Lower chest: Mild atelectasis is seen within the left lung base. Hepatobiliary: A 3.0 cm x 1.0 cm x 3.7 cm mildly lobulated area of low attenuation is seen along the anteromedial border of the right lobe of the liver. This  is anterior and superior to the gallbladder fossa. No gallstones, gallbladder wall thickening, or biliary dilatation. Pancreas: Unremarkable. No pancreatic ductal dilatation or surrounding inflammatory changes. Spleen: Normal in size without focal abnormality. Adrenals/Urinary Tract: Adrenal glands are unremarkable. Kidneys are normal, without renal calculi, focal lesion, or hydronephrosis. Bladder is unremarkable. Stomach/Bowel: Stomach is within normal limits. Appendix appears normal. No evidence of bowel wall thickening, distention, or inflammatory changes. Vascular/Lymphatic: No significant vascular findings are present. No enlarged abdominal or pelvic lymph nodes. Reproductive: Prostate is unremarkable. Other: There is a 3.1 cm x 2.3 cm fat containing left inguinal hernia. No abdominopelvic ascites. A mild amount of inflammatory fat  stranding is seen within the anteromedial aspect of the mid left abdomen. Multiple associated subcentimeter mesenteric lymph nodes are seen (axial CT images 34 through 50, CT series 3). Musculoskeletal: No acute or significant osseous findings. IMPRESSION: 1. 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 2. Small fat-containing left inguinal hernia. 3. Findings which may represent a focal area of hepatic steatosis. Correlation with nonemergent hepatic ultrasound is recommended to exclude the presence of an underlying neoplastic process. Electronically Signed   By: Aram Candelahaddeus  Houston M.D.   On: 08/08/2021 01:35   ? ?Procedures ?Procedures  ? ? ?Medications Ordered in ED ?Medications  ?insulin regular, human (MYXREDLIN) 100 units/ 100 mL infusion (has no administration in time range)  ?lactated ringers infusion (has no administration in time range)  ?dextrose 5 % in lactated ringers infusion (has no administration in time range)  ?dextrose 50 % solution 0-50 mL (has no administration in time range)  ?lactated ringers  bolus 2,000 mL (has no administration in time range)  ? ? ?ED Course/ Medical Decision Making/ A&P ?  ? ?Patient seen and examined. History obtained directly from patient. Work-up including labs, imaging, EKG ord

## 2021-08-08 NOTE — Progress Notes (Addendum)
Inpatient Diabetes Program Recommendations ? ?AACE/ADA: New Consensus Statement on Inpatient Glycemic Control (2015) ? ?Target Ranges:  Prepandial:   less than 140 mg/dL ?     Peak postprandial:   less than 180 mg/dL (1-2 hours) ?     Critically ill patients:  140 - 180 mg/dL  ? ?Lab Results  ?Component Value Date  ? GLUCAP 282 (H) 08/08/2021  ? HGBA1C 10.7 (H) 08/08/2021  ? ? ?Review of Glycemic Control ? Latest Reference Range & Units 08/08/21 05:54 08/08/21 07:47 08/08/21 12:05  ?Glucose-Capillary 70 - 99 mg/dL 425 (H) 323 (H) 282 (H)  ?(H): Data is abnormally high ?Diabetes history: New onset DM ?Outpatient Diabetes medications: Jardiance QD ?Current orders for Inpatient glycemic control: Semglee 15 units QD, Novolog 0-9 units TID & HS ? ?Inpatient Diabetes Program Recommendations:   ? ?Spoke with patient at length regarding outpatient diabetes management. Patient knew he had an elevated A1C but did not know that meant he had diabetes. Receives care through the [place].  ?Reviewed patient's current A1c of 10.7%. Explained what a A1c is and what it measures. Also reviewed goal A1c with patient, importance of good glucose control @ home, and blood sugar goals. Reviewed patho of DM, role of pancreas, need for insulin, role of insulin, survival skills, hypo vs hyper glycemia, interventions, oral medications, vascular changes and commorbidities.  ?Patient will need a glucose meter. #[id number]. Also, discussed Freestyle libre as a potential option for application, risks vs benefits, cost, coverage with [facility] and the benefits of using the sample as a tool. Patient interested, secure chat sent to MD. MD to discuss with patient further.  ?Admits to consuming carbohydrates and drinking sugary beverages. Reviewed counting carbohydrates, plate method, and alternatives to drinking sugary beverages. Dietitian consult, LLWDM an outpatient education ordered.  ?Educated patient on insulin pen use at home. Reviewed contents of insulin  flexpen starter kit. Reviewed all steps if insulin pen including attachment of needle, 2-unit air shot, dialing up dose, giving injection, removing needle, disposal of sharps, storage of unused insulin, disposal of insulin etc. Patient able to provide successful return demonstration. Also reviewed troubleshooting with insulin pen. MD to give patient Rxs for insulin pens and insulin pen needles. ?Insulin starter kit ordered and encouraged patient to begin learning self injection. Patient somewhat hesitant.  ? ?Will follow up 3/31.  ?Thanks, ?Bronson Curb, MSN, RNC-OB ?Diabetes Coordinator ?[phone] (8a-5p) ? ? ? ?

## 2021-08-09 DIAGNOSIS — N179 Acute kidney failure, unspecified: Secondary | ICD-10-CM | POA: Diagnosis not present

## 2021-08-09 DIAGNOSIS — I10 Essential (primary) hypertension: Secondary | ICD-10-CM | POA: Diagnosis not present

## 2021-08-09 DIAGNOSIS — E1165 Type 2 diabetes mellitus with hyperglycemia: Secondary | ICD-10-CM | POA: Diagnosis not present

## 2021-08-09 DIAGNOSIS — E785 Hyperlipidemia, unspecified: Secondary | ICD-10-CM | POA: Diagnosis not present

## 2021-08-09 LAB — CBC WITH DIFFERENTIAL/PLATELET
Abs Immature Granulocytes: 0.01 10*3/uL (ref 0.00–0.07)
Basophils Absolute: 0 10*3/uL (ref 0.0–0.1)
Basophils Relative: 0 %
Eosinophils Absolute: 0.3 10*3/uL (ref 0.0–0.5)
Eosinophils Relative: 4 %
HCT: 37.9 % — ABNORMAL LOW (ref 39.0–52.0)
Hemoglobin: 13.5 g/dL (ref 13.0–17.0)
Immature Granulocytes: 0 %
Lymphocytes Relative: 37 %
Lymphs Abs: 2.2 10*3/uL (ref 0.7–4.0)
MCH: 27.9 pg (ref 26.0–34.0)
MCHC: 35.6 g/dL (ref 30.0–36.0)
MCV: 78.3 fL — ABNORMAL LOW (ref 80.0–100.0)
Monocytes Absolute: 0.4 10*3/uL (ref 0.1–1.0)
Monocytes Relative: 7 %
Neutro Abs: 3 10*3/uL (ref 1.7–7.7)
Neutrophils Relative %: 52 %
Platelets: 208 10*3/uL (ref 150–400)
RBC: 4.84 MIL/uL (ref 4.22–5.81)
RDW: 13.3 % (ref 11.5–15.5)
WBC: 5.8 10*3/uL (ref 4.0–10.5)
nRBC: 0 % (ref 0.0–0.2)

## 2021-08-09 LAB — COMPREHENSIVE METABOLIC PANEL
ALT: 38 U/L (ref 0–44)
AST: 32 U/L (ref 15–41)
Albumin: 3.8 g/dL (ref 3.5–5.0)
Alkaline Phosphatase: 109 U/L (ref 38–126)
Anion gap: 13 (ref 5–15)
BUN: 19 mg/dL (ref 6–20)
CO2: 26 mmol/L (ref 22–32)
Calcium: 8.7 mg/dL — ABNORMAL LOW (ref 8.9–10.3)
Chloride: 96 mmol/L — ABNORMAL LOW (ref 98–111)
Creatinine, Ser: 1.97 mg/dL — ABNORMAL HIGH (ref 0.61–1.24)
GFR, Estimated: 41 mL/min — ABNORMAL LOW (ref >60)
Glucose, Bld: 293 mg/dL — ABNORMAL HIGH (ref 70–99)
Potassium: 3.5 mmol/L (ref 3.5–5.1)
Sodium: 135 mmol/L (ref 135–145)
Total Bilirubin: 0.9 mg/dL (ref 0.3–1.2)
Total Protein: 6.7 g/dL (ref 6.5–8.1)

## 2021-08-09 LAB — GLUCOSE, CAPILLARY
Glucose-Capillary: 259 mg/dL — ABNORMAL HIGH (ref 70–99)
Glucose-Capillary: 254 mg/dL — ABNORMAL HIGH (ref 70–99)

## 2021-08-09 LAB — MAGNESIUM: Magnesium: 2.1 mg/dL (ref 1.7–2.4)

## 2021-08-09 MED ORDER — INSULIN ASPART 100 UNIT/ML FLEXPEN: 0.0000 [IU] | PEN_INJECTOR | Freq: Three times a day (TID) | SUBCUTANEOUS | 0 refills | Status: DC

## 2021-08-09 MED ORDER — INSULIN ASPART 100 UNIT/ML IJ SOLN: 0.0000 [IU] | Freq: Every day | INTRAMUSCULAR | Status: DC

## 2021-08-09 MED ORDER — INSULIN PEN NEEDLE 31G X 5 MM MISC: 0 refills | Status: DC

## 2021-08-09 MED ORDER — INSULIN GLARGINE 100 UNIT/ML SOLOSTAR PEN: 20.0000 [IU] | PEN_INJECTOR | Freq: Every day | SUBCUTANEOUS | 0 refills | Status: DC

## 2021-08-09 MED ORDER — FREESTYLE LIBRE 3 SENSOR MISC: 0 refills | Status: AC

## 2021-08-09 MED ORDER — INSULIN ASPART 100 UNIT/ML IJ SOLN
3.0000 [IU] | Freq: Three times a day (TID) | INTRAMUSCULAR | Status: DC
  Administered 2021-08-09 (×2): 3 [IU] via SUBCUTANEOUS

## 2021-08-09 MED ORDER — INSULIN ASPART 100 UNIT/ML IJ SOLN
0.0000 [IU] | Freq: Three times a day (TID) | INTRAMUSCULAR | Status: DC
  Administered 2021-08-09 (×2): 8 [IU] via SUBCUTANEOUS

## 2021-08-09 MED ORDER — AMLODIPINE BESYLATE 5 MG PO TABS
5.0000 mg | ORAL_TABLET | Freq: Every day | ORAL | 0 refills | Status: AC
Start: 2021-08-10 — End: ?

## 2021-08-09 NOTE — Progress Notes (Signed)
NT walked pt down to transportation with all belongings.  ?

## 2021-08-09 NOTE — Plan of Care (Signed)
Nutrition Education Note ? ?RD consulted for nutrition education regarding new onset diabetes.  ? ?Lab Results  ?Component Value Date  ? HGBA1C 10.7 (H) 08/08/2021  ? ? ?RD provided "Carbohydrate Counting for People with Diabetes" and "Plate Method for Diabetics" handout from the Academy of Nutrition and Dietetics. Discussed different food groups and their effects on blood sugar, emphasizing carbohydrate-containing foods. Provided list of carbohydrates and recommended serving sizes of common foods. ? ?Discussed importance of controlled and consistent carbohydrate intake throughout the day. Provided examples of ways to balance meals/snacks. Teach back method used. ? ?Expect good compliance. Pt with no other questions or concerns.  ? ?Pt reported that him and his wife have recently started exercising and eating better in hopes to lose weight and be healthier. Pt reports that he was drinking a strawberry banana smoothie for breakfast most days. Pt endorses a 10# weight loss related to recent life-style changes.  ? ?Current diet order is Heart Health/CHO Modified. No meal intakes have been recorded in EMR. Labs and medications reviewed. No further nutrition interventions warranted at this time.  ? ? If additional nutrition issues arise, please re-consult RD. ? ? ?Kirby Crigler RD, LDN ?Clinical Dietitian ?See AMiON for contact information.  ? ? ?

## 2021-08-09 NOTE — Progress Notes (Signed)
Garrett Thompson is alert and oriented x4. Breathing is even and nonlabored, no complaints of pain. Educated patient on discharge instructions, and stated his understanding. Patient was able to participate in administering his lunch time insulin with no issues, and completed teach back adequately. PIV removed with no issues. Belongings gathered, and waiting from transportation.  ?

## 2021-08-09 NOTE — Progress Notes (Signed)
Inpatient Diabetes Program Recommendations ? ?AACE/ADA: New Consensus Statement on Inpatient Glycemic Control (2015) ? ?Target Ranges:  Prepandial:   less than 140 mg/dL ?     Peak postprandial:   less than 180 mg/dL (1-2 hours) ?     Critically ill patients:  140 - 180 mg/dL  ? ?Lab Results  ?Component Value Date  ? GLUCAP 254 (H) 08/09/2021  ? HGBA1C 10.7 (H) 08/08/2021  ? ? ?Review of Glycemic Control ? ?Inpatient Diabetes Program Recommendations:   ?MD ordered application of Freestyle CGM at discharge for patient. Education done regarding application and changing CGM sensor (alternate every 14 days on back of arms), 1 hour warm-up, use of glucometer when alert displays, how to scan CGM for glucose reading and information for PCP. Patient has also been given educational packet regarding use CGM sensor including the 1-800 toll free number for any questions, problems or needs related to the [facility] sensors or reader.    Sensor applied to (L) Arm at 100 p.  Explained that glucose readings will not be available until 1 hour after application. Reviewed use of CGM including how to scan, changing Sensor, Vitamin C warning, arrows with glucose readings, and Freestyle app. Gave patient Freestyle reader as well due to the app not being compatible with her phone.  Patient very appreciative.  ? ?Reviewed with patient on insulin pen use at home. Reviewed contents of insulin flexpen starter kit. Reviewed all steps of insulin pen including attachment of needle, 2-unit air shot, dialing up dose, giving injection, removing needle, disposal of sharps, storage of unused insulin, disposal of insulin etc. Patient able to provide successful return demonstration. Also reviewed troubleshooting with insulin pen. MD to give patient Rxs for insulin pens and insulin pen needles.  ? ?Requested RN to allow patient to give insulin @ noon to practice prior to discharge home. ? ?Thank you, ?Nani Gasser Ismerai Bin, RN, MSN, CDE  ?Diabetes  Coordinator ?Inpatient Glycemic Control Team ?Team Pager [phone] (8am-5pm) ?08/09/2021 1:23 PM ? ? ?

## 2021-08-10 NOTE — Discharge Summary (Signed)
?Physician Discharge Summary ?  ?Patient: Garrett Thompson. MRN: [id number] DOB: 12-12-1975  ?Admit date:     08/07/2021  ?Discharge date: 08/09/2021  ?Discharge Physician: Tyrone Nine  ? ?PCP: [facility]  ? ?Recommendations at discharge:  ?Continue management of T2DM per PCP at [facility]. Started on basal-bolus insulin as outlined below. ?Continue diabetes education and dietitian services after discharge ?Consider semaglutide for weight loss and T2DM. ?Recheck BMP at follow up, will likely, if renal function improved, be able to restart ACE or ARB. Started on norvasc 5mg  in the interim to control BP in setting of AKI. ? ?Discharge Diagnoses: ?Principal Problem: ?  AKI (acute kidney injury) (HCC) ?Active Problems: ?  Uncontrolled diabetes mellitus with hyperglycemia, without long-term current use of insulin (HCC) ?  Essential hypertension ?  Hyperlipidemia ? ?Hospital Course: ?Garrett Kawika Bischoff. is a 46 y.o. male 56 with a history of HTN, [name] and recently started on jardiance who presented to the ED with nausea, vomiting, diarrhea, lightheadedness as well as blurry vision and polyuria/polydipsia from several weeks but worsening over the previous few days. He was afebrile, tachycardic, hypertensive with metabolic derangements including AKI (SCr 2.4 from baseline ~1.7) and severe hyperglycemia without acidosis, anion gap, or ketonuria. He was admitted earlier this morning by Dr. Benin for AKI due to mild HHNKS due to poorly-controlled T2DM. IV fluids started, and basal-bolus insulin has been ordered.  ? 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 Creatinine improved to 1.97. This is on the borderline of IIIa and IIIb, so no definitive determination can be made at this time. ?- Recheck metabolic panel at follow up. Avoid NSAIDs, discussed w/patient.  ?- Long term, glycemic control and nephrology follow up is advised ?  ?HHNKS due to poorly-controlled  T2DM: HbA1c 10.7% which was on jardiance.  ?- Doubt oral medications will bring HbA1c to goal by themselves. We will institute glargine insulin 10u daily and low dose mealtime + moderate sliding scale novolog insulin regimen. Pt to check CBGs with freestyle libre, provided at discharge, and I have confirmed is dispensed at [facility] today.  ?- Pt familiar with insulin administration and eager to control CBGs well.  ?- Dietitian consulted, information provided, recommend continuing dietitian education/services at discharge.  ?- Unable to start metformin due to renal impairment.  ?  ?HTN:  ?- Will reinitiate an ACE/ARB pending improvement in renal function  ?- Continue norvasc ?  ?Dyslipidemia:  ?- Continue statin. LDL (direct) is 61.4. HDL 27.  ?  ?Hepatic steatosis: No discrete lesion on liver U/S. LFTs wnl. ? ?Consultants: Diabetes coordinator, dietitian ?Procedures performed: None  ?Disposition: Home ?Diet recommendation:  ?Discharge Diet Orders (From admission, onward)  ? ?  Start     Ordered  ? 08/09/21 0000  Diet - low sodium heart healthy       ? 08/09/21 1239  ? 08/09/21 0000  Diet Carb Modified       ? 08/09/21 1239  ? ?  ?  ? ?  ? ?Cardiac and Carb modified diet ?DISCHARGE MEDICATION: ?Allergies as of 08/09/2021   ? ?   Reactions  ? Mobic [meloxicam]   ? Nose Bleed  ? Mushroom Extract Complex   ? Shellfish Allergy   ? ?  ? ?  ?Medication List  ?  ? ?STOP taking these medications   ? ?ALPRAZolam 0.25 MG tablet ?Commonly known as: Xanax ?  ?hydrochlorothiazide  25 MG tablet ?Commonly known as: HYDRODIURIL ?  ?lisinopril 10 MG tablet ?Commonly known as: ZESTRIL ?  ? ?  ? ?TAKE these medications   ? ?acetaminophen 500 MG tablet ?Commonly known as: TYLENOL ?Take 1,000 mg by mouth every 8 (eight) hours as needed for mild pain or headache. ?What changed: Another medication with the same name was removed. Continue taking this medication, and follow the directions you see here. ?  ?albuterol 108 (90 Base)  MCG/ACT inhaler ?Commonly known as: VENTOLIN HFA ?Inhale 2 puffs into the lungs every 4 (four) hours as needed for wheezing or shortness of breath (or coughing). ?  ?amLODipine 5 MG tablet ?Commonly known as: NORVASC ?Take 1 tablet (5 mg total) by mouth daily. ?  ?cetirizine 10 MG tablet ?Commonly known as: ZYRTEC ?Take 10 mg by mouth daily as needed for allergies. ?  ?fluticasone 50 MCG/ACT nasal spray ?Commonly known as: FLONASE ?Place 1 spray into both nostrils daily as needed for allergies. ?  ?FreeStyle Libre 3 Sensor Misc ?Place 1 sensor on the skin every 14 days. Use to check glucose continuously ?  ?insulin aspart 100 UNIT/ML FlexPen ?Commonly known as: NOVOLOG ?Inject 0-15 Units into the skin 3 (three) times daily with meals. dose as indicated based on sliding scale ?  ?insulin glargine 100 UNIT/ML Solostar Pen ?Commonly known as: LANTUS ?Inject 20 Units into the skin daily. ?  ?Insulin Pen Needle 31G X 5 MM Misc ?Inject insulin via insulin pen 4 times daily ?  ?Melatonin 10 MG Caps ?Take 10 mg by mouth at bedtime as needed (sleep). ?  ?omeprazole 20 MG capsule ?Commonly known as: PRILOSEC ?Take 20 mg by mouth daily. ?  ?ONE A DAY MENS VITACRAVES PO ?Take 1 tablet by mouth daily. ?  ?traZODone 100 MG tablet ?Commonly known as: DESYREL ?Take 1 tablet by mouth at bedtime as needed. ?  ? ?  ? ? Follow-up Information   ? ? [facility] Schedule an appointment as soon as possible for a visit.   ?Contact information: ?[address] ?[place] [address] ?[phone] ? ? ?  ?  ? ?  ?  ? ?  ? ?Subjective: Dressed in normal clothes, wants to go home ASAP. Has given insulin to grandfather before, feels prepared to administer insulin pens. Wife and Aunt at bedside and supportive.  ? ?Discharge Exam: ?BP (!) 144/97 (BP Location: Left Arm)   Pulse 83   Temp 98.2 ?F (36.8 ?C) (Oral)   Resp 18   SpO2 96%   ?No distress, appears well, euvolemic ?Clear, nonlabored ?RRR without  edema ? ?Condition at discharge: good ? ?The results of significant diagnostics from this hospitalization (including imaging, microbiology, ancillary and laboratory) are listed below for reference.  ? ?Imaging Studies: ?CT ABDOMEN PELVIS WO CONTRAST ? ?Result Date: 08/08/2021 ?CLINICAL DATA:  Nausea, vomiting and diarrhea. EXAM: CT ABDOMEN AND PELVIS WITHOUT CONTRAST TECHNIQUE: Multidetector CT imaging of the abdomen and pelvis was performed following the standard protocol without IV contrast. RADIATION DOSE REDUCTION: This exam was performed according to the departmental dose-optimization program which includes automated exposure control, adjustment of the mA and/or kV according to patient size and/or use of iterative reconstruction technique. COMPARISON:  None. FINDINGS: Lower chest: Mild atelectasis is seen within the left lung base. Hepatobiliary: A 3.0 cm x 1.0 cm x 3.7 cm mildly lobulated area of low attenuation is seen along the anteromedial border of the right lobe of the liver. This is anterior and  superior to the gallbladder fossa. No gallstones, gallbladder wall thickening, or biliary dilatation. Pancreas: Unremarkable. No pancreatic ductal dilatation or surrounding inflammatory changes. Spleen: Normal in size without focal abnormality. Adrenals/Urinary Tract: Adrenal glands are unremarkable. Kidneys are normal, without renal calculi, focal lesion, or hydronephrosis. Bladder is unremarkable. Stomach/Bowel: Stomach is within normal limits. Appendix appears normal. No evidence of bowel wall thickening, distention, or inflammatory changes. Vascular/Lymphatic: No significant vascular findings are present. No enlarged abdominal or pelvic lymph nodes. Reproductive: Prostate is unremarkable. Other: There is a 3.1 cm x 2.3 cm fat containing left inguinal hernia. No abdominopelvic ascites. A mild amount of inflammatory fat stranding is seen within the anteromedial aspect of the mid left abdomen. Multiple associated  subcentimeter mesenteric lymph nodes are seen (axial CT images 34 through 50, CT series 3). Musculoskeletal: No acute or significant osseous findings. IMPRESSION: 1. Findings within the mid left abdomen which may represe

## 2021-09-17 ENCOUNTER — Encounter: Payer: No Typology Code available for payment source | Attending: Family Medicine | Admitting: Nutrition

## 2021-09-17 DIAGNOSIS — E1165 Type 2 diabetes mellitus with hyperglycemia: Secondary | ICD-10-CM | POA: Insufficient documentation

## 2021-09-18 NOTE — Patient Instructions (Addendum)
Stop all sweet drinks, fruit juices and cold cereal and milk Use water in smoothie, and add protein powder.  Monitor blood sugar 2 hr. After.  If blood sugar reading is over 180, reduce the amount of banana  Continue to walk for 1 hour every day Add protein to breakfast meals by using egg whites, or 2T of peanut butter, or low fat cheese 5.  Contact Md if blood sugars drop below 70.   6.  Make sure all meals have protein, some carbohydrate in the form      Of fruits, whole grain carbohydrates, or milk, and not too much       Fat.   7.  Call if questions, &/or return in 4 weeks for review.

## 2021-09-18 NOTE — Progress Notes (Signed)
Patient says he was newly diagnosed with diabetes last weekend when he went to the ER with a blood sugar of 500+. The MD told him that his HgbA1C was high in his [facility] paperwork that he carried with him, so, is not sure when this first started.    He has a strong family history of diabetes on his father's side. Insulin:  Says was put on Lantus   and Novolog, but the novolog was stopped yesterday when his blood sugar droppped to 51.  FBS today was 131. Says has no difficulty with giving insulin.  Reviewed use/storage/administration of insulin, as well as appropriate injection sites.  CGM:  I have 3 meters, but is now wearing a Libre 2.  Time in range: 81%.with average blood sugar 150.   Says has stopped all sweet drinks, and deserts, reduced his meal portions by 1/2, and stopped all all foods with sugar in them.   Current Diet History 9AM: up  takes Lantus 10:  2-3X/week has a smoothie with bananas, strawberries, and apple juice      Discussion:  Idea of insulin reisistance, and what he can do to decrease this.: exercise, weight loss, diet Discussed the 3 different food groups and how they effect blood sugar and the need for all 3 food groups, but not too much of any one of them Stop all sweet drinks, fruit juices and cold cereal and milk.  Continue exercise for 30-45 min. 4-5 days/week Goals for blood sugar readings: AC: less than 110, 2hr. Pc: less than 160.   Reviewed low blood sugar-symptoms and appropriate treatments.   He was given a meal plan of 2200 calories of no more than 45 carbs at bfast and lunch, and no more than 60 acS.   Discussed effects of high fat meals on blood sugar and need to reduce salad dressings, mayo, and fried foods,  Handout given on balancing meals, goals for blood  sugar readings,and  how to read labels,  We review each one and he reported good understanding of each.

## 2021-10-10 ENCOUNTER — Ambulatory Visit: Payer: No Typology Code available for payment source | Admitting: Family Medicine

## 2021-10-15 ENCOUNTER — Ambulatory Visit: Payer: No Typology Code available for payment source | Admitting: Dietician

## 2021-12-09 ENCOUNTER — Emergency Department (HOSPITAL_COMMUNITY)
Admission: EM | Admit: 2021-12-09 | Discharge: 2021-12-10 | Discharge status: Against Medical Advice | Payer: No Typology Code available for payment source | Attending: Emergency Medicine | Admitting: Emergency Medicine

## 2021-12-09 DIAGNOSIS — R112 Nausea with vomiting, unspecified: Secondary | ICD-10-CM | POA: Insufficient documentation

## 2021-12-09 DIAGNOSIS — R109 Unspecified abdominal pain: Secondary | ICD-10-CM | POA: Diagnosis present

## 2021-12-09 DIAGNOSIS — Z5321 Procedure and treatment not carried out due to patient leaving prior to being seen by health care provider: Secondary | ICD-10-CM | POA: Insufficient documentation

## 2021-12-09 LAB — CBC WITH DIFFERENTIAL/PLATELET
Abs Immature Granulocytes: 0.04 10*3/uL (ref 0.00–0.07)
Basophils Absolute: 0 10*3/uL (ref 0.0–0.1)
Basophils Relative: 0 %
Eosinophils Absolute: 0.1 10*3/uL (ref 0.0–0.5)
Eosinophils Relative: 1 %
HCT: 44.9 % (ref 39.0–52.0)
Hemoglobin: 15.1 g/dL (ref 13.0–17.0)
Immature Granulocytes: 0 %
Lymphocytes Relative: 13 %
Lymphs Abs: 1.5 10*3/uL (ref 0.7–4.0)
MCH: 26.8 pg (ref 26.0–34.0)
MCHC: 33.6 g/dL (ref 30.0–36.0)
MCV: 79.8 fL — ABNORMAL LOW (ref 80.0–100.0)
Monocytes Absolute: 0.7 10*3/uL (ref 0.1–1.0)
Monocytes Relative: 6 %
Neutro Abs: 9.6 10*3/uL — ABNORMAL HIGH (ref 1.7–7.7)
Neutrophils Relative %: 80 %
Platelets: 278 10*3/uL (ref 150–400)
RBC: 5.63 MIL/uL (ref 4.22–5.81)
RDW: 14 % (ref 11.5–15.5)
WBC: 12.1 10*3/uL — ABNORMAL HIGH (ref 4.0–10.5)
nRBC: 0 % (ref 0.0–0.2)

## 2021-12-09 LAB — COMPREHENSIVE METABOLIC PANEL
ALT: 28 U/L (ref 0–44)
AST: 25 U/L (ref 15–41)
Albumin: 4.4 g/dL (ref 3.5–5.0)
Alkaline Phosphatase: 100 U/L (ref 38–126)
Anion gap: 10 (ref 5–15)
BUN: 11 mg/dL (ref 6–20)
CO2: 22 mmol/L (ref 22–32)
Calcium: 9 mg/dL (ref 8.9–10.3)
Chloride: 109 mmol/L (ref 98–111)
Creatinine, Ser: 1.89 mg/dL — ABNORMAL HIGH (ref 0.61–1.24)
GFR, Estimated: 44 mL/min — ABNORMAL LOW (ref >60)
Glucose, Bld: 153 mg/dL — ABNORMAL HIGH (ref 70–99)
Potassium: 3.4 mmol/L — ABNORMAL LOW (ref 3.5–5.1)
Sodium: 141 mmol/L (ref 135–145)
Total Bilirubin: 1.2 mg/dL (ref 0.3–1.2)
Total Protein: 7.8 g/dL (ref 6.5–8.1)

## 2021-12-09 LAB — LIPASE, BLOOD: Lipase: 41 U/L (ref 11–51)

## 2021-12-09 NOTE — ED Triage Notes (Signed)
Pt c/o L flank/abd pain, nausea, emesis x1 "projectile vomited across the living room." Denies exacerbating factors/ foods. States he ate 1 time today-lunch. On ozympic x58mos, states no issues.

## 2021-12-09 NOTE — ED Provider Triage Note (Signed)
Emergency Medicine Provider Triage Evaluation Note  Garrett Thompson. , a 46 y.o. male  was evaluated in triage.  Pt complains of intermittent abdominal pain through today.  Worse following meals.  Reports a few episodes of vomiting.  Denies fever, chills, dysuria, hematuria.  Denies prior history of kidney stones.  Review of Systems  Positive: As above Negative: As above  Physical Exam  BP 120/82 (BP Location: Left Arm)   Pulse 84   Temp 98.2 F (36.8 C) (Oral)   Resp 16   SpO2 98%  Gen:   Awake, no distress   Resp:  Normal effort  MSK:   Moves extremities without difficulty  Other:  Mild diffuse abdominal tenderness present.  Medical Decision Making  Medically screening exam initiated at 9:47 PM.  Appropriate orders placed.  Garrett Thompson. was informed that the remainder of the evaluation will be completed by another provider, this initial triage assessment does not replace that evaluation, and the importance of remaining in the ED until their evaluation is complete.     Marita Kansas, PA-C 12/09/21 2148

## 2021-12-10 NOTE — ED Notes (Signed)
NAx4 for vitals 

## 2022-06-18 IMAGING — DX DG ANKLE PORT 2V*L*
2 series · 2 of 2 positions shown · non-contrast
Comparison: 05/29/2020

CLINICAL DATA: Post reduction

EXAM:
PORTABLE LEFT ANKLE - 2 VIEW

[ankle ap]
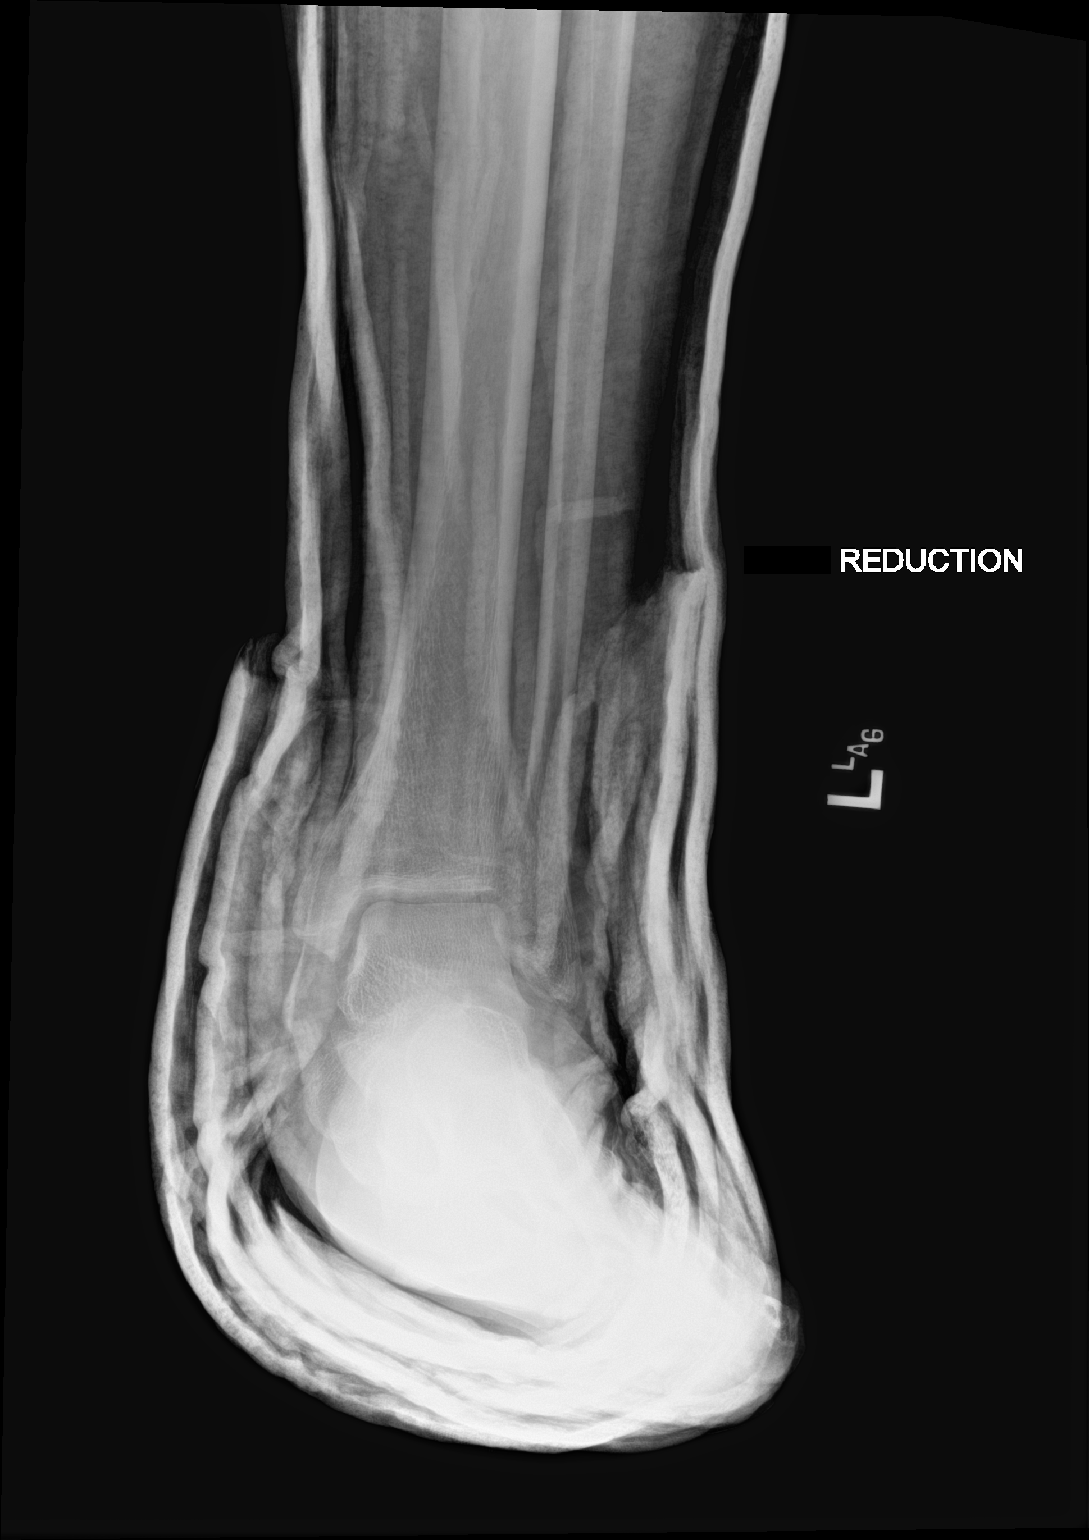

[ankle obl]
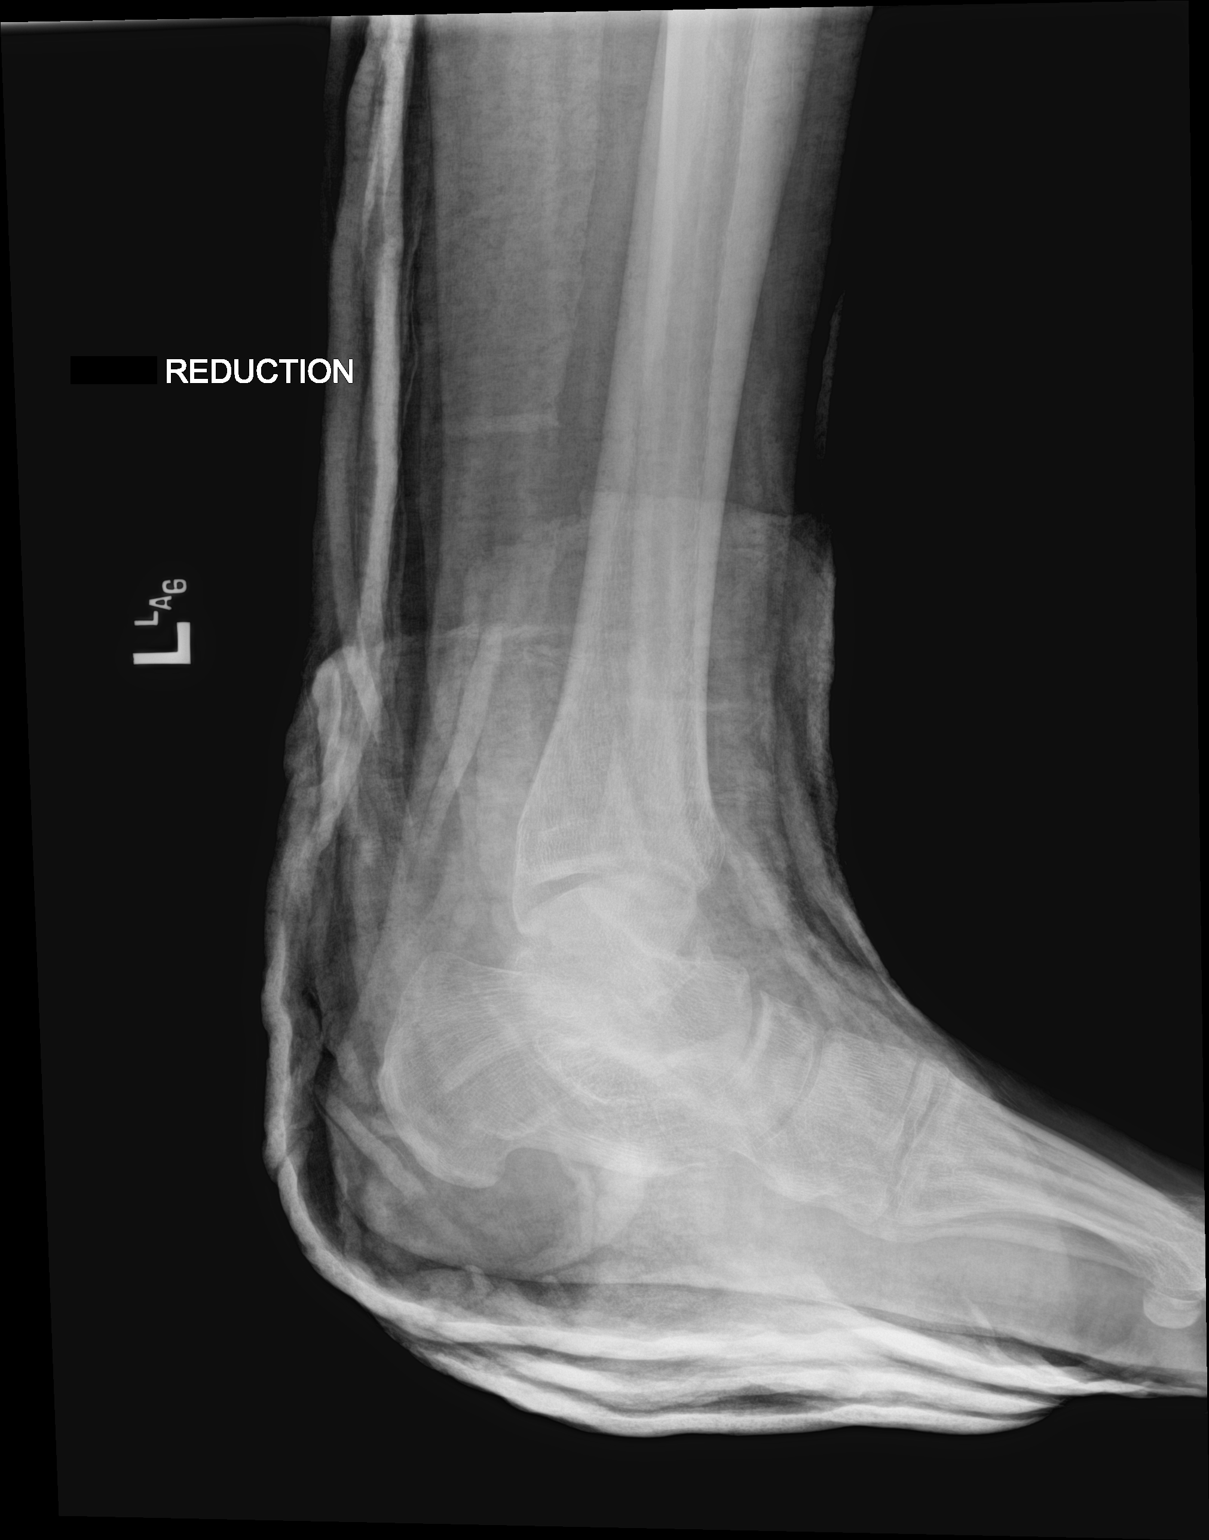

[2 of 2 positions shown; findings below may reference images not displayed]

FINDINGS: Casting material limits bone detail. Reduction of previously noted
ankle dislocation with more anatomic alignment. Medial malleolar
fracture difficult to see through casting material. Decreased
displacement and angulation of distal fibular fracture.
IMPRESSION: Reduction of previously noted ankle fracture dislocation as
described above.

## 2022-06-18 IMAGING — CR DG ANKLE COMPLETE 3+V*L*
3 series · 3 of 3 positions shown · non-contrast
Comparison: None.

CLINICAL DATA: Fall

EXAM:
LEFT ANKLE COMPLETE - 3+ VIEW

[x ankle ap left]
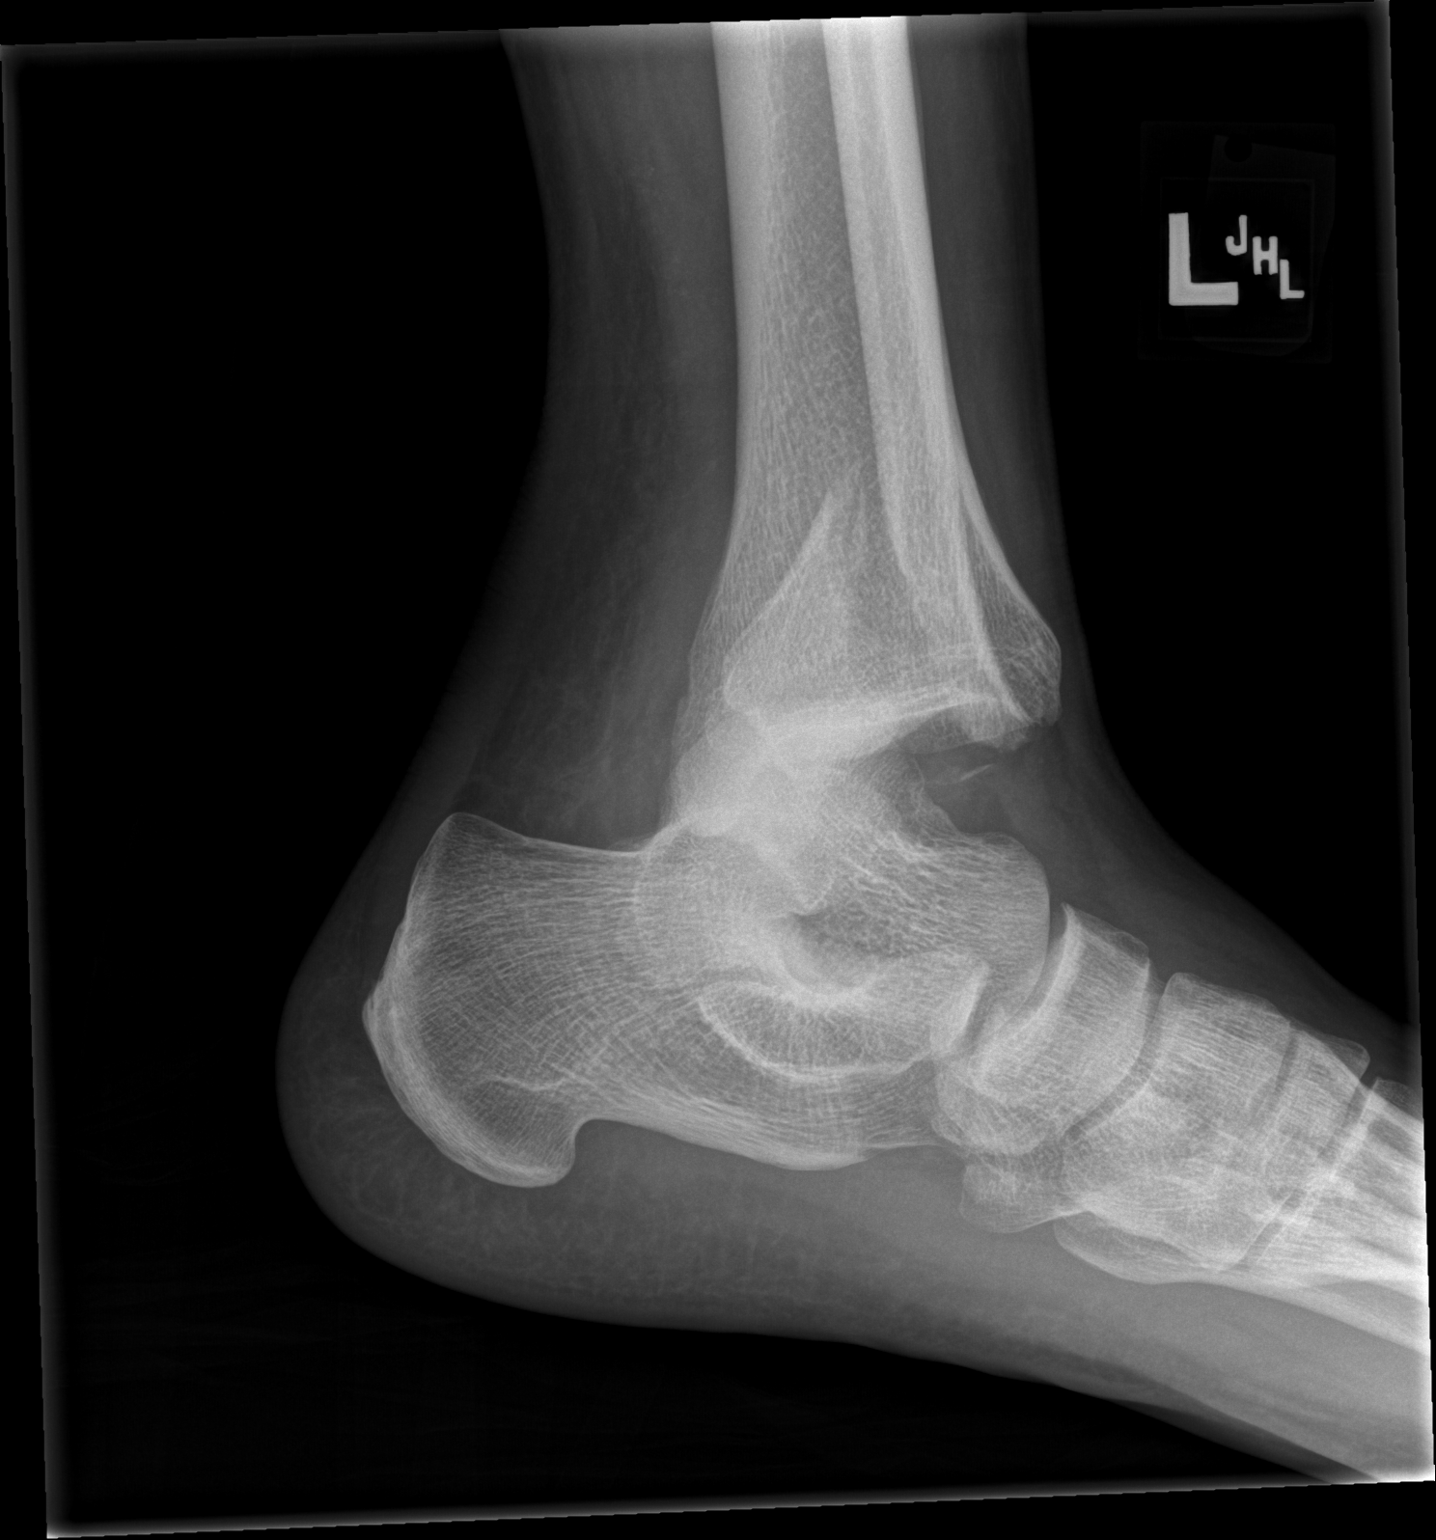

[x ankle obl left]
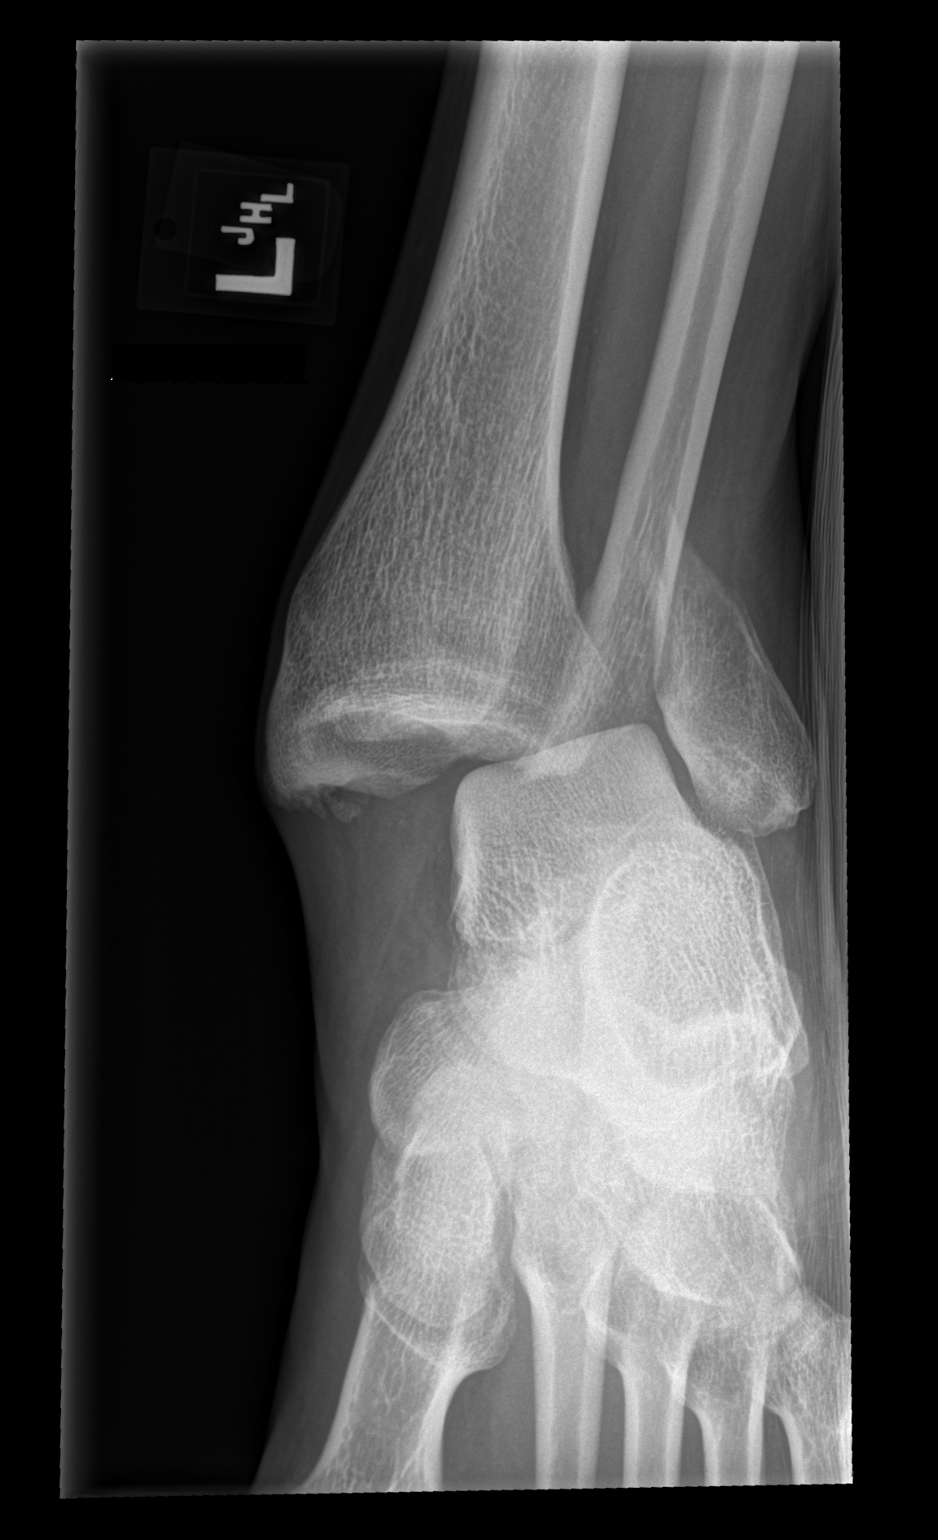

[x ankle lat left]
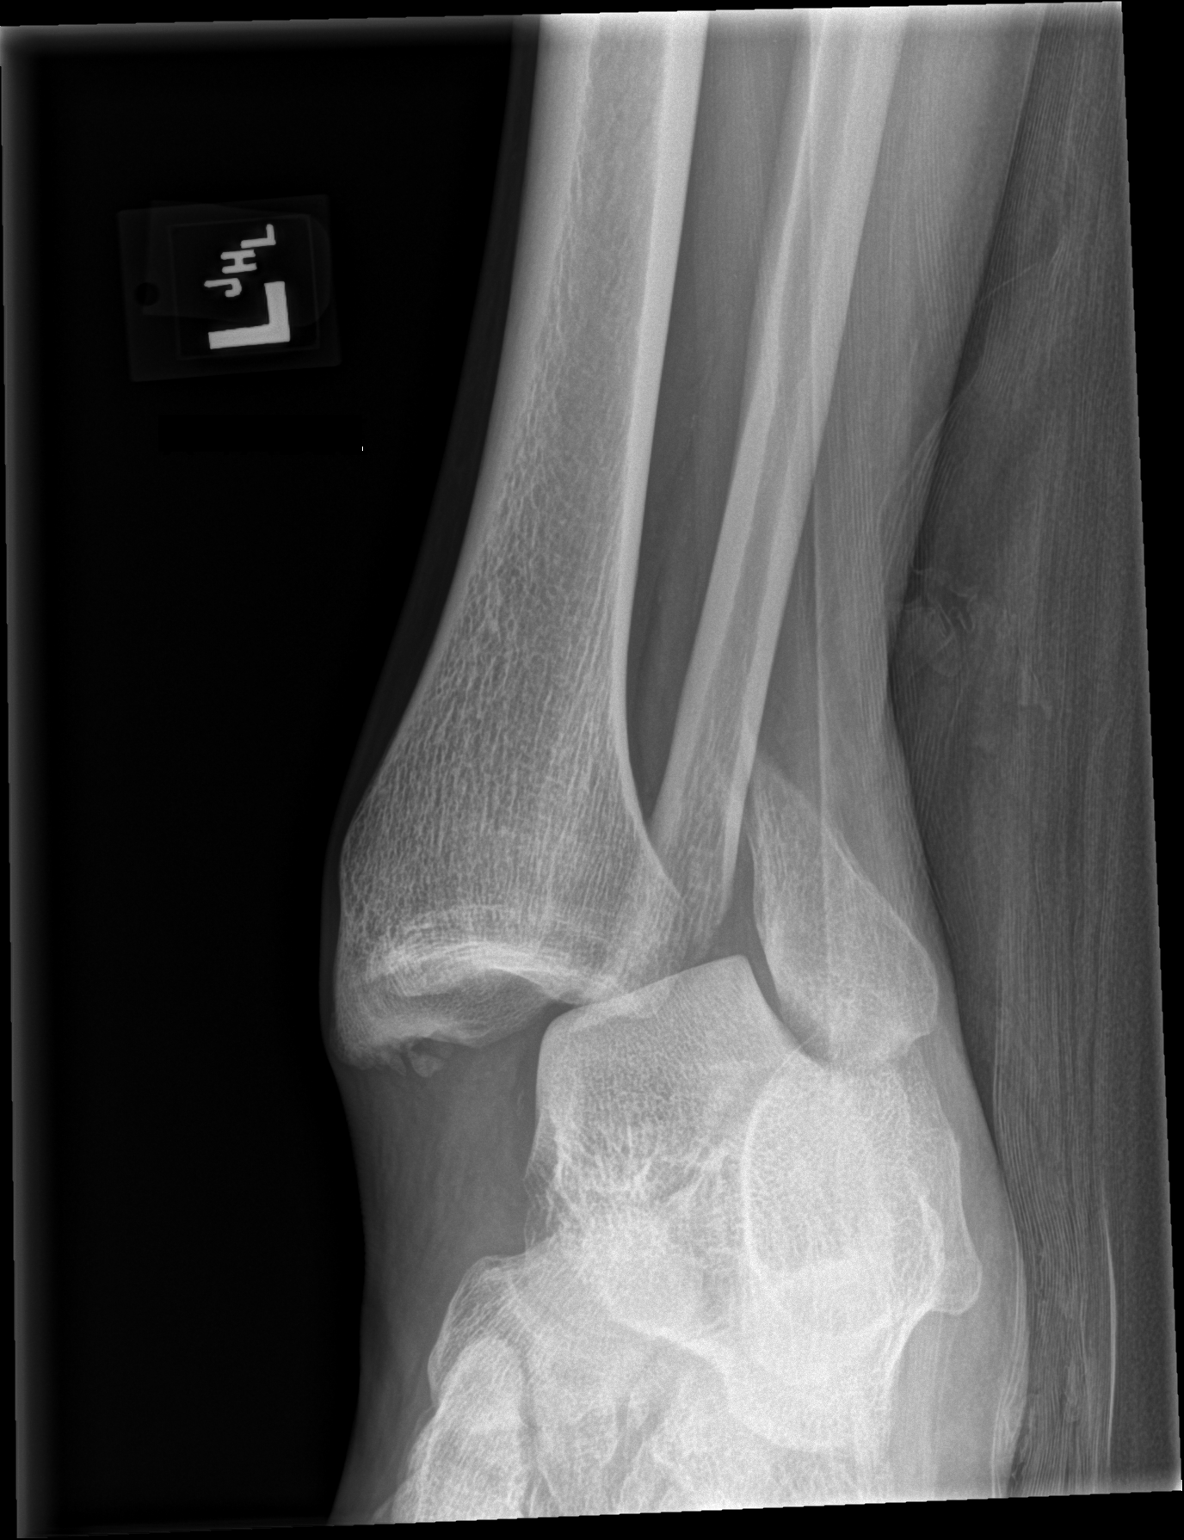

[3 of 3 positions shown; findings below may reference images not displayed]

FINDINGS: Acute fracture involving distal shaft of the fibula with greater
than 1 shaft diameter lateral and posterior displacement of distal
fracture fragment in addition to moderate angulation. Acute mildly
comminuted fracture involving the medial malleolus of the distal
tibia. Lateral and posterior dislocation of the talus with respect
to the distal tibia.
IMPRESSION: Acute displaced and angulated fracture involving distal fibula with
acute mildly comminuted medial malleolar fracture. There is lateral
and posterior dislocation of the talus with respect to the distal
tibia.

## 2023-05-28 ENCOUNTER — Encounter: Payer: Self-pay | Admitting: *Deleted

## 2023-05-28 DIAGNOSIS — Z8659 Personal history of other mental and behavioral disorders: Secondary | ICD-10-CM | POA: Insufficient documentation

## 2023-05-28 DIAGNOSIS — S82852A Displaced trimalleolar fracture of left lower leg, initial encounter for closed fracture: Secondary | ICD-10-CM | POA: Insufficient documentation

## 2023-05-28 DIAGNOSIS — F32A Depression, unspecified: Secondary | ICD-10-CM | POA: Insufficient documentation

## 2023-05-28 DIAGNOSIS — M1990 Unspecified osteoarthritis, unspecified site: Secondary | ICD-10-CM | POA: Insufficient documentation

## 2023-05-28 DIAGNOSIS — F0634 Mood disorder due to known physiological condition with mixed features: Secondary | ICD-10-CM | POA: Insufficient documentation

## 2023-05-28 DIAGNOSIS — N289 Disorder of kidney and ureter, unspecified: Secondary | ICD-10-CM | POA: Insufficient documentation

## 2023-05-29 ENCOUNTER — Ambulatory Visit (INDEPENDENT_AMBULATORY_CARE_PROVIDER_SITE_OTHER): Payer: No Typology Code available for payment source | Admitting: Neurology

## 2023-05-29 ENCOUNTER — Encounter: Payer: Self-pay | Admitting: Neurology

## 2023-05-29 VITALS — BP 153/93 | HR 84 | Ht 73.0 in | Wt 260.0 lb

## 2023-05-29 DIAGNOSIS — M5412 Radiculopathy, cervical region: Secondary | ICD-10-CM

## 2023-05-29 DIAGNOSIS — M5416 Radiculopathy, lumbar region: Secondary | ICD-10-CM

## 2023-05-29 DIAGNOSIS — E1142 Type 2 diabetes mellitus with diabetic polyneuropathy: Secondary | ICD-10-CM | POA: Diagnosis not present

## 2023-05-29 DIAGNOSIS — Z7984 Long term (current) use of oral hypoglycemic drugs: Secondary | ICD-10-CM

## 2023-05-29 DIAGNOSIS — R200 Anesthesia of skin: Secondary | ICD-10-CM | POA: Diagnosis not present

## 2023-05-29 DIAGNOSIS — R29898 Other symptoms and signs involving the musculoskeletal system: Secondary | ICD-10-CM

## 2023-05-29 DIAGNOSIS — E66811 Obesity, class 1: Secondary | ICD-10-CM

## 2023-05-29 DIAGNOSIS — M79672 Pain in left foot: Secondary | ICD-10-CM

## 2023-05-29 DIAGNOSIS — R202 Paresthesia of skin: Secondary | ICD-10-CM | POA: Diagnosis not present

## 2023-05-29 MED ORDER — OZEMPIC (1 MG/DOSE) 2 MG/1.5ML ~~LOC~~ SOPN: 2.0000 mg | PEN_INJECTOR | SUBCUTANEOUS | 6 refills | Status: DC

## 2023-05-29 NOTE — Progress Notes (Addendum)
 [facility]    Provider:  Dr Tresia Fruit Requesting Provider: Dondra Fuel, MD Primary Care Provider:  [facility]  CC:  neck pain and shooting pain up the back of the head, sleep apnea, stroke  05/29/2023: patient here as a new consult for left arm pain.  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 He as multiple risk factors for peripheral neuropathy such as obesity, we discussed increasing his ozempic  and I happy to call that inf or him and [facility]. He has sensitivity in is feet and low back pain, his left arm also hurts. He has had MRI lumbar spine and cervical spine at the [place].  He sees the [place] [facility] for diagnosed Chronic Pain Syndrome: Notes from the [place], pain clinic provider Adrian Alba April 27, 2023: She reviewed interventions including cognitive behavioral therapy, supportive counseling and reflective listening.  He was also seen at the [facility] mental health clinic April 24, 2023.  [facility] and primary care.  Per primary care notes October 31, managing his type 2 diabetes on Ozempic , neuropathy is still severe and right ankle very very sensitive, neck pain history of C5-C6 fusion August 2009 he  continues with chronic neck pain and radiation of pain down and affecting the entire left upper extremity to hand, he has weakness left upper extremity hand to decrease sensation, spine surgeon tells him this is likely as good as it will get as he has a lot of neck pain and headache.  He was given a TENS unit for treating his headaches.  He has had epidural steroid injections.  He has declined spinal cord stimulator.  Fracture of left ankle status postsurgery June 05, 2020 and his ongoing pain and instability follows with orthopedics, there is a lot of arthritis in the foot and ankle.  He saw Mariah Shines T. Thurston Flow also agreed the MRI showed persistent degenerative changes and spurs but no definite compression of the nerves going to the left arm, nerve conduction studies have [facility] by the [facility] apparently the patient is here today and has not had the studies as of yet.  From review of records [facility] : Hemoglobin A1c 5.4, BUN 13, AST 37, ALT 39, sodium 144, potassium 3.7, creatinine 2.14, EGFR 37, TSH 2.285 normal, March 13, 2023 per review of [facility] records on "Care Everywhere". MRI cervical with multilevel degenerative changes however no significant foraminal stenosis at any level.  He is status post C5-C6 ACDF.  MRI cervical spine: 03/12/2023:   Case [id number] COMPLETE)MRI CERVICAL SPINE W/O CONTRAST (MRI Detailed) [id number] Reason for Study: left arm loss of sensation   Clinical History: Clinical History:   known cervical DDD with left hand numbness but now now able to feel trauma (cut his finger)   Findings:   Alignment: There is straightening of the cervical lordosis. No  significant spondylolisthesis is seen.  Osseous structures: Vertebral body heights are maintained. There  is no compression fracture. No aggressive osseous lesion is seen.   The patient is status post prior C5-C6 ACDF using anterior plate,  screws, and interbody bone graft material. Metallic  susceptibility artifact  degrades evaluation of the hardware.  There is solid osseous interbody fusion.   Spinal canal: There is no cord signal abnormality.   General degenerative changes: There is multilevel disc  desiccation. There are multilevel small endplate osteophytes.   Evaluation of the individual levels:   C2-C3: There is a small disc osteophyte complex. There is  bilateral uncovertebral hypertrophy. There is mild left foraminal  narrowing. There is no canal stenosis.   C3-C4: There is a moderate disc osteophyte complex. There is  bilateral uncovertebral hypertrophy. There is mild left foraminal  narrowing. There is no canal stenosis.   C4-C5: There is a moderate disc osteophyte complex which  minimally indents the ventral cord without definite associated  cord signal abnormality. There is bilateral uncovertebral  hypertrophy. There is no foraminal stenosis or canal stenosis.   C5-C6: Status post ACDF. There is no foraminal stenosis or canal  stenosis.   C6-C7: There is a small disc osteophyte complex. There is  bilateral uncovertebral hypertrophy. There is mild right and  moderate left foraminal narrowing. There is no canal stenosis.   C7-T1: There is no foraminal stenosis or canal stenosis.   Additional observations: The visualized posterior fossa  structures are unremarkable. No paraspinal abnormality is seen.   Patient complains of symptoms per HPI as well as the following symptoms: none . Pertinent negatives and positives per HPI. All others negative  Patient complains of symptoms per HPI as well as the following symptoms: Depression, anxiety, stress, reflux, neck pain, back pain, knee pain, left ankle pain, headaches, PTSD sleep issues. Pertinent negatives and positives per HPI. All others negative    04/08/2021: we did an extensive workup His cholesterol panel is very abnormal, his bad cholesterol LDL is 179, he needs to get that down to <70, I highly recommend seeing his primary  care for a statin(ask him where to send these results). He is diabetic with hgba1c of 7.1, he should be treated for diabetes and again I encourage him to see his pcp. His kidney function is impaired, but this is not new he has CKD. He has at least one gene for sickle cell and he should discuss that with his primary care. The other labs are ok. Thanks. See phone note. We tried to reach him.   He had admission for DKA after this. He was admitted for this. He has since been treated fot HTN with medications, diabetes  and hyperlipidemia on statin. He is on ozempic . He has improved with headaches, We had recommended a sleep apnea testing in 2022 and on cpap. Weiht is a risk factor for multiple conditions, recommend weight 215 or BMI 28 continue ozempic  for diabetes and weight loss. Increased the dose  He has left arm pain, right unaffected, the pinky fingers go down and elbow pain, weaknes, dropping things, decreased strength in the hand, he has neck pain and radoiculopathy. Left foot, hurts to walk, painin the arch and heel morso. Onlin the left foot. He broke his ankle and he had surgery and has rods in it on the left and has had pain since then. Also sensitivity. No low back pain or radiculopathy.   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   No acute abnormality.    05/29/2023: I highly recommend seeing Dr. Mona Angle to see if there is anything at all that can be done injection wise,RFA or otherwise to help with the neck pain. [name], he has diabetes, HTN, hld, he is doing it all at the [place]. He is managing these through the [place], [name], cholesterol medication,   HPI:  Garrett Thompson. is a 48 y.o. male here as requested by Dondra Fuel, MD for neck pain and headaches. The headaches start in the back of the head  and radiate to the top of the head. Lightning bolts in his head. Throbbing in between. He has having physical therapy, he says he has stroke but I have no records. He had a CT scan and he was told her had a stroke but never had an MRI of the brain. He has uncontrolled high blood pressure. He says he had an episode where he had an odd episode in March. His wife also provides much information. The pain is shooting up the back of the neck. He has also thought about a spinal stimulator. Tried ESI injections but not medial branch blocks. Not migrainous, no migraine symptoms. Triptans did not help. Tried Gabapentin . Headaches are daily, shooting with throbbing in between. Also lots of neck pain. He is doing physical therapy. Ongoing for over a year. Worsening. Also blurry vision. He also snores heavily, recommended sleep study as well, discussed. No hx of migraines. Very tired, snore heavily, Significant neck pain. Wife says he snores heavily, he falls asleep instantaneously.   Reviewed notes, labs and imaging from outside physicians, which showed:  I reviewed MRI of the cervical spine images and agree with the following: C1-C2 and craniocervical junction unremarkable.  C2-C3 moderate left uncovertebral osteophytosis and mild left facet arthrosis with mild left neural foraminal narrowing.  C3-C4 moderate bulging disc osteophyte complex moderate left uncovertebral osteophytosis mild left facet arthrosis mild spinal canal stenosis and minimal deformity of the central cord.  Left vertebral artery loop extending medially may contribute to compressive radiculopathy upon exiting left C4 nerve roots.  C4-C5 moderate to severe broad central disc osteophyte complex with mild spinal canal stenosis and mild deformity of central cord.  Anterior discectomy and interbody fusion without spinal canal stenosis or neuroforaminal narrowing.  C6-C7 small central disc protrusion superimposed upon moderate bulging disc osteophyte complex  with moderate to severe left and moderate right uncovertebral osteophytosis.  No spinal canal stenosis or deformity of the cord.  Mild-moderate left and mild right neural foraminal narrowing.  Mild bilateral uncovertebral osteophytosis more pronounced on the right without neuroforaminal narrowing.  Review of Systems: Patient complains of symptoms per HPI as well as the following symptoms: fatigue,ankle pain . Pertinent negatives and positives per HPI. All others negative    Social History   Socioeconomic History   Marital status: Married    Spouse name: stacy   Number of children: 7   Years of education: Not on file   Highest education level: Bachelor's degree (e.g., BA, AB, BS)  Occupational History   Not on file  Tobacco Use   Smoking status: Never   Smokeless tobacco: Never  Vaping Use   Vaping status: Never Used  Substance and Sexual Activity   Alcohol use: Yes    Comment: occ   Drug use:  No   Sexual activity: Not on file  Other Topics Concern   Not on file  Social History Narrative   Lives with wife Loyce Ruffini and 1 adult child and 3 minor children   Right handed   Caffeine: 4 sodas a day, 2 energy drinks day   Social Drivers of Health   Financial Resource Strain: Low Risk  (12/10/2021)   Received from [facility], [facility]   Overall Financial Resource Strain (CARDIA)    Difficulty of Paying Living Expenses: Not very hard  Food Insecurity: Not on file  Transportation Needs: Not on file  Physical Activity: Not on file  Stress: No Stress Concern Present (12/10/2021)   Received from [facility], [facility]   [employer] [facility]ational [facility] - Occupational Stress Questionnaire    Feeling of Stress : Not at all  Social Connections: Unknown (12/10/2021)   Received from [facility], [facility]   Social Network    Social Network: Not on file  Intimate Partner Violence: Unknown (12/10/2021)   Received from [facility], [facility]   HITS    Physically  Hurt: Not on file    Insult or Talk Down To: Not on file    Threaten Physical Harm: Not on file    Scream or Curse: Not on file    Family History  Problem Relation Age of Onset   Heart attack Mother    Diabetes Paternal Grandfather    High blood pressure Paternal Grandfather    Migraines Neg Hx     Past Medical History:  Diagnosis Date   Constipation    GERD (gastroesophageal reflux disease)    Hypertension    Neuropathy    Stroke (HCC)    at age 40   Type 2 diabetes mellitus (HCC)     Patient Active Problem List   Diagnosis Date Noted   Depression 05/28/2023   Osteoarthrosis 05/28/2023   Closed trimalleolar fracture of left ankle 05/28/2023   Kidney disorder 05/28/2023   History of dysthymia 05/28/2023   Mood disorder with mixed features due to general medical condition 05/28/2023   AKI (acute kidney injury) (HCC) 08/08/2021   Uncontrolled diabetes mellitus with hyperglycemia, without long-term current use of insulin  (HCC) 08/08/2021   Essential hypertension 08/08/2021   Hyperlipidemia 08/08/2021   Cerebrovascular accident (CVA) (HCC) 03/06/2021   Excessive daytime sleepiness 03/06/2021   Hemisensory deficit 03/06/2021   Loud snoring 03/06/2021   Cervicogenic headache 12/25/2020   Bilateral occipital neuralgia 12/25/2020   Cervicalgia 12/25/2020     Past Surgical History:  Procedure Laterality Date   neck fusion     ORIF ANKLE FRACTURE Left 06/05/2020   Procedure: LEFT OPEN REDUCTION INTERNAL FIXATION (ORIF) ANKLE FRACTURE;  Surgeon: Jasmine Mesi, MD;  Location: [facility];  Service: Orthopedics;  Laterality: Left;   SPINAL FUSION  2009    Current Outpatient Medications  Medication Sig Dispense Refill   acetaminophen  (TYLENOL ) 500 MG tablet Take 1,000 mg by mouth every 8 (eight) hours as needed for mild pain or headache.     albuterol  (PROVENTIL  HFA;VENTOLIN  HFA) 108 (90 BASE) MCG/ACT inhaler Inhale 2 puffs into the lungs every 4 (four)  hours as needed for wheezing or shortness of breath (or coughing). 1 Inhaler 0   amLODipine  (NORVASC ) 5 MG tablet Take 1 tablet (5 mg total) by mouth daily. 30 tablet 0   aspirin  EC 81 MG tablet Take by mouth.     aspirin -acetaminophen -caffeine (EXCEDRIN MIGRAINE) 250-250-65 MG tablet  Take by mouth.     B Complex Vitamins (VITAMIN B COMPLEX) CAPS Take 2 capsules by mouth daily.     cetirizine (ZYRTEC) 10 MG tablet Take 10 mg by mouth daily as needed for allergies.     citalopram (CELEXA) 20 MG tablet Take 10 mg by mouth 2 (two) times daily.     Continuous Blood Gluc Sensor (FREESTYLE LIBRE 3 SENSOR) MISC Place 1 sensor on the skin every 14 days. Use to check glucose continuously 2 each 0   docusate sodium (COLACE) 100 MG capsule Take 200 mg by mouth daily.     empagliflozin (JARDIANCE) 25 MG TABS tablet Take 12.5 mg by mouth daily.     fluticasone (FLONASE) 50 MCG/ACT nasal spray Place 1 spray into both nostrils daily as needed for allergies.     gabapentin  (NEURONTIN ) 300 MG capsule Take 300 mg by mouth at bedtime.     lisinopril (ZESTRIL) 10 MG tablet Take 10 mg by mouth 2 (two) times daily.     Multiple Vitamin (MULTIVITAMINS PO) Take 1 tablet by mouth daily.     Multiple Vitamins-Minerals (ONE A DAY MENS VITACRAVES PO) Take 1 tablet by mouth daily.     omeprazole  (PRILOSEC) 20 MG capsule Take 20 mg by mouth daily.     OMEPRAZOLE  PO Take 20 mg by mouth daily.     pregabalin (LYRICA) 75 MG capsule Take 75 mg by mouth 2 (two) times daily. And take 2 capsules by mouth at bedtime for neuralgia     PSYLLIUM PO Take by mouth.     Semaglutide , 1 MG/DOSE, (OZEMPIC , 1 MG/DOSE,) 2 MG/1.5ML SOPN Inject 2 mg into the skin once a week. 6 mL 6   sildenafil (VIAGRA) 100 MG tablet Take by mouth.     simethicone (MYLICON) 80 MG chewable tablet Chew by mouth.     Melatonin 10 MG CAPS Take 10 mg by mouth at bedtime as needed (sleep).     QUEtiapine (SEROQUEL) 100 MG tablet Take 100 mg by mouth at bedtime.  (Patient not taking: Reported on 09/24/2023)     traZODone  (DESYREL ) 100 MG tablet Take 1 tablet by mouth at bedtime as needed. (Patient not taking: Reported on 05/29/2023)     No current facility-administered medications for this visit.    Allergies as of 05/29/2023 - Review Complete 05/29/2023  Allergen Reaction Noted   Amitriptyline  05/28/2023   Cyclobenzaprine  05/28/2023   Methocarbamol  05/28/2023   Mobic [meloxicam]  05/10/2013   Mushroom extract complex (obsolete)  05/31/2020   Other  05/28/2023   Rosuvastatin  05/28/2023   Shellfish allergy  05/31/2020   Sumatriptan  05/28/2023   Tizanidine  05/28/2023    Vitals: BP (!) 153/93 (BP Location: Right Arm, Patient Position: Sitting, Cuff Size: Large)   Pulse 84   Ht 6\' 1"  (1.854 m)   Wt 260 lb (117.9 kg)   BMI 34.30 kg/m  Last Weight:  Wt Readings from Last 1 Encounters:  09/24/23 241 lb (109.3 kg)   Last Height:   Ht Readings from Last 1 Encounters:  09/24/23 6\' 1"  (1.854 m)    Physical exam: Exam: Gen: NAD, conversant, well nourised, obese, well groomed                     CV: RRR, no MRG. No Carotid Bruits. No peripheral edema, warm, nontender Eyes: Conjunctivae clear without exudates or hemorrhage  Neuro: Detailed Neurologic Exam  Speech:    Speech  is normal; fluent and spontaneous with normal comprehension.  Cognition:    The patient is oriented to person, place, and time;     recent and remote memory intact;     language fluent;     normal attention, concentration,     fund of knowledge Cranial Nerves:    The pupils are equal, round, and reactive to light. Visual fields are full to finger confrontation. Extraocular movements are intact. Trigeminal sensation is intact and the muscles of mastication are normal. The face is symmetric. The palate elevates in the midline. Hearing intact. Voice is normal. Shoulder shrug is normal. The tongue has normal motion without fasciculations.   Coordination:     Normal finger to nose and heel to shin. Normal rapid alternating movements.   Gait: Cannot walk on heel mostly leg heel.   Motor Observation:    No asymmetry, no atrophy, and no involuntary movements noted. Tone:    Normal muscle tone.    Posture:    Posture is normal. normal erect    Strength: left triceps weaknes hoever due to elbow pain. Weakness left hand interossei. Otherwise. Left foot weaknes DF/PF and possible some wekaness in left leg flexion vs oain. Left leg invesion/eversion weakness. Otherwise strength is V/V in the upper and lower limbs. 14 screws in the left foot and decrease ROM pain on when I move it.      Sensation: intact to LT, pin prick distally in the feet      Reflex Exam:  DTR's:    Deep tendon reflexes in the upper and lower extremities are normal bilaterally.   Toes:    The toes are downgoing bilaterally.   Clonus:    Clonus is absent.    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 . Also chronic meck pain that we cannot manage here in neurology already seeing pain clinic and orthopaedics for his neck pain and left ankle pain s/p surgery, see below  PLAN:  09/24/2023 addendum:  Can refill his ozempic  this one time but going forward needs [facility] to [facility]. He denies diarrhea except with a recent stomach virus. Feels tremendously better on the ozempic , has lost weight, better glucose control, not on insulin , no side effect, his joints and ankle feel better and his ankle on the left has no pain, significant improvement of life can refill at current dose but follow up with [facility] physician at checkups to manage.  - Left arm pain eval for ulnar neuropathy vs cervical radiculopathy: bilateral upper extremity  EMG/NCS(not lower, he has known diabetic polyneuropathy and he has known neuropathy and arthritc changes and chronic pain in the left foot since surgery already managed by orthopaedics  and Pain Clinic and Primary care at the [facility]) - Chronic neck pain but now with new symptoms left arm numbness. Status post prior C5-C6 ACDF. Multilevel  degenerative changes on recent MRI cervical spine but no significant foraminal stenosis or radiologic nerve impingement. He has cervical disc disease and follow with [facility], [facility] at the [facility] and pain clinic at the [facility], not a surgical candidate he declined spinal cord stimulator. - Discuss increasing Ozemic dose to help with weight loss which may also help with chronic pain (his hemoglobin A1c is well-controlled 5.4) he denies any side effects to the Ozempic  including constipation, he is doing well on it;  - He has multiple risk factors for peripheral neuropathy most important may be diabetes, also obesity is an independent risk factor for polyneuropathy., we discussed increasing his ozempic  for weight loss and I happy to call that inf or him and he can follow with [facility] for further management but I did caution him to watch his blood glucose and if he has low blood glucose or any symptoms of low blood glucose including shaking, sweating, confusion, dizziness, nausea or any acute symptoms please ensure to eat something and proceed to the emergency room  if symptoms do not improve. - Continue managing HTN, Cholesterol, Diabetes. And other vascular risk factors  - Left Arm: cervical nerve pinching or Ulnar neuropathy:EMG/NCS left arm andleft leg for cervical radiculopathy vs ulnar neuropathy in the left arm  - Left foot: Had sugery, now pain on walking. Differentia: arthritis from surgery, lumbar radiculopathy - MRI of the cervical spine reports reviewed, could not find MRI lumbar spine results reviewed [facility] notes back to 05/2022 - MRI of the brain;: showed multiple lacunar stroke likely due to multiple vascular risk factors ([name], HTN, OBESITY, UNTREATED SLEEP APNEA). Complete sleep eval workup including imaging blood vessels, labs, echocardiogram, heart monitor. - Chronic neck pain and cervicalgia: We cannot do more for patient here in neurology. He has to be treated by pain management. I have also recommended seeing Dr. Mona Angle for eval of other injections to his neck. But his headaches are cervicogenic and due to chronic neck pain which we do not manage. REFERRED TO PAIN MANAGEMENT and [facility] he has declined spinal cord stimulator per [place] notes but follows with pain clinic regularly.    Orders Placed This Encounter  Procedures   NCV with EMG(electromyography)   NCV with EMG(electromyography)    Meds ordered this encounter  Medications   DISCONTD: Semaglutide , 1 MG/DOSE, (OZEMPIC , 1 MG/DOSE,) 2 MG/1.5ML SOPN    Sig: Inject 2 mg into the skin once a week.    Dispense:  6 mL    Refill:  6   Semaglutide , 1 MG/DOSE, (OZEMPIC , 1 MG/DOSE,) 2 MG/1.5ML SOPN    Sig: Inject 2 mg into the skin once a week.    Dispense:  6 mL    Refill:  6   I spent over 45 minutes of face-to-face and non-face-to-face time with patient on the  1. Numbness and tingling in left arm   2. Left hand weakness   3. Type 2 diabetes mellitus with diabetic polyneuropathy, without long-term current use of insulin  (HCC)   4. Left foot pain   5. Obesity  (BMI 30.0-34.9)   6. Left lumbar radiculopathy   7. Left cervical radiculopathy    diagnosis.  This included previsit chart review, lab review, study review, order entry, electronic health record documentation, patient education on the different diagnostic and therapeutic options, counseling and coordination of care, risks and benefits of management, compliance, or risk factor reduction   Cc: Dondra Fuel, MD,  Clinic, Minnette Amato, MD  [facility] [address] [place], [place] [address]  Phone [phone] Fax [phone]

## 2023-05-29 NOTE — Patient Instructions (Addendum)
Discuss increasing Ozemic dose to hekp with weight loss, he is doing well on it Continue managing HTN, Cholesterol, Diabetes.  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    Dispense:  6 mL    Refill:  6   Orders Placed This Encounter  Procedures   NCV with EMG(electromyography)      Differential   Pinched nerve in neck: likely C8    Ulnar neuropathy:    Ulnar neuropathy is a condition that occurs when the ulnar nerve is damaged or compressed. The ulnar nerve runs from the shoulder to the hand and controls movement of the wrist, hand, and arm.  Symptoms  Pain, burning, tingling, or numbness in the hand or arm Weakness in the hand or arm Loss of sensation or coordination Sensitivity to cold Tenderness in the elbow joint Causes  Trauma to the elbow or wrist Repetitive motions, such as bending the elbow or leaning on hard surfaces Long-term pressure on the elbow or base of the palm Systemic disorders, such as diabetes or arthritis Treatment  Non-steroidal anti-inflammatory drugs (NSAIDs) Cortisone injection Splint to secure the elbow Cubital tunnel release surgery Treatment massage, ice, and anti-inflammatories Ulnar neuropathy is diagnosed through a combination of clinical assessment and nerve conduction studies  Plantar fasciitis:  Plantar Fasciitis: What to Know  Your plantar fascia is a band of thick tissue on the bottom of your foot. It connects your heel bone to the base of your toes. If the fascia gets irritated, it can cause pain in your heel or foot. This is called plantar fasciitis. In some cases,  plantar fasciitis can make it hard for you to walk or move. The pain is often worse in the morning after sleeping, or after sitting or lying down for a long time. Pain may also be worse after walking or standing for a long time. What are the causes? Plantar fasciitis may be caused by: Standing for a long time. Wearing shoes that don't have good arch support. Doing high-impact activities. These are things that put stress on your joints. They include: Ballet. Aerobic exercises. These are exercises that make your heart beat faster. Being overweight. Having a way of walking, or gait, that isn't normal. Tight muscles in your calf, which is in the back of your lower leg. High arches in your feet, or flat feet. Starting a new sport or activity. What are the signs or symptoms? The main symptom of plantar fasciitis is heel pain. Your pain may get worse after: You take your first steps after a time of rest. This includes in the morning after you wake up, or after you've been sitting or lying down for a while. Standing still for a long time. Pain may lessen after 30-45 minutes of activity, such as gentle walking. How is this diagnosed? Plantar fasciitis may be diagnosed based on your medical history, your symptoms, and an exam. Your health care provider will check for: A tender spot on the bottom of your foot. A high arch in your foot, or flat feet. Pain when you move your foot. Trouble moving your foot. You may also have  tests. These may include: X-rays. Ultrasound. MRI. How is this treated? Treatment depends on how bad your plantar fasciitis is. It may include: RICE therapy. This stands for rest, ice, pressure (compression), and raising (elevating) the foot. Exercises to stretch your calves and plantar fascia. A night splint. This holds your foot in a stretched, upward position while you sleep. Physical therapy. This can help with symptoms. It can also prevent problems in the future. Shots  of a steroid medicine called cortisone. This can help with pain and irritation. Extracorporeal shock wave therapy. This uses electric shocks to stimulate your plantar fascia. If other treatments don't help, you may need to have surgery. Follow these instructions at home: Managing pain, stiffness, and swelling  Use ice, an ice pack, or a frozen bottle of water as told. Place a towel between your skin and the ice. Roll the bottom of your foot over the ice or frozen bottle. Do this for 20 minutes, 2-3 times a day. If your skin turns red, take off the ice right away to prevent skin damage. The risk of damage is higher if you can't feel pain, heat, or cold. Wear shoes that have air-sole or gel-sole cushions. You could also try soft shoe inserts made for plantar fasciitis. Activity Try not to do things that cause pain. Ask what things are safe for you to do. Exercise as told. Try activities that are low impact. This means that they're easier on your joints. They include: Swimming. Water aerobics. Biking. General instructions Take your medicines only as told. Wear a night splint as told. Loosen the splint if your toes tingle, are numb, or turn cold and blue. Stay at a healthy weight. Work with your provider to lose weight as needed. Contact a health care provider if: Your symptoms don't go away with treatment. You have pain that gets worse. Your pain makes it hard to move or do everyday things. This information is not intended to replace advice given to you by your health care provider. Make sure you discuss any questions you have with your health care provider. Document Revised: 09/29/2022 Document Reviewed: 09/29/2022 Elsevier Patient Education  2024 [employer]  Electromyoneurogram Electromyoneurogram is a test to check how well your muscles and nerves are working. This procedure includes the combined use of electromyogram (EMG) and nerve conduction study (NCS). EMG is used to evaluate  muscles and the nerves that control those muscles. NCS, which is also called electroneurogram, measures how well your nerves conduct electricity. The procedures should be done together to check if your muscles and nerves are healthy. If the results of the tests are abnormal, this may indicate disease or injury, such as a neuromuscular disease or peripheral nerve damage. Tell a health care provider about: Any allergies you have. All medicines you are taking, including vitamins, herbs, eye drops, creams, and over-the-counter medicines. Any bleeding problems you have. Any surgeries you have had. Any medical conditions you have. What are the risks? Generally, this is a safe procedure. However, problems may occur, including: Bleeding or bruising. Infection where the electrodes were inserted. What happens before the test? Medicines Take all of your usually prescribed medications before this testing is performed. Do not stop your blood thinners unless advised by your prescribing physician. General instructions Your health care provider may ask you to warm the limb that will be checked with warm water, hot pack, or wrapping the limb in a blanket. Do not use lotions or creams on the same day that you  will be having the procedure. What happens during the test? For EMG  Your health care provider will ask you to stay in a position so that the muscle being studied can be accessed. You will be sitting or lying down. You may be given a medicine to numb the area (local anesthetic) and the skin will be disinfected. A very thin needle that has an electrode will be inserted into your muscle, one muscle at a time. Typically, multiple muscles are evaluated during a single study. Another small electrode will be placed on your skin near the muscle. Your health care provider will ask you to continue to remain still. The electrodes will record the electrical activity of your muscles. You may see this on a monitor  or hear it in the room. After your muscles have been studied at rest, your health care provider will ask you to contract or flex your muscles. The electrodes will record the electrical activity of your muscles. Your health care provider will remove the electrodes and the electrode needle when the procedure is finished. The procedure may vary among health care providers and hospitals. For NCS  An electrode that records your nerve activity (recording electrode) will be placed on your skin by the muscle that is being studied. An electrode that is used as a reference (reference electrode) will be placed near the recording electrode. A paste or gel will be applied to your skin between the recording electrode and the reference electrode. Your nerve will be stimulated with a mild shock. The speed of the nerves and strength of response is recorded by the electrodes. Your health care provider will remove the electrodes and the gel when the procedure is finished. The procedure may vary among health care providers and hospitals. What can I expect after the test? It is up to you to get your test results. Ask your health care provider, or the department that is doing the test, when your results will be ready. Your health care provider may: Give you medicines for any pain. Monitor the insertion sites to make sure that bleeding stops. You should be able to drive yourself to and from the test. Discomfort can persist for a few hours after the test, but should be better the next day. Contact a health care provider if: You have swelling, redness, or drainage at any of the insertion sites. Summary Electromyoneurogram is a test to check how well your muscles and nerves are working. If the results of the tests are abnormal, this may indicate disease or injury. This is a safe procedure. However, problems may occur, such as bleeding and infection. Your health care provider will do two tests to complete this  procedure. One checks your muscles (EMG) and another checks your nerves (NCS). It is up to you to get your test results. Ask your health care provider, or the department that is doing the test, when your results will be ready. This information is not intended to replace advice given to you by your health care provider. Make sure you discuss any questions you have with your health care provider. Document Revised: 01/09/2021 Document Reviewed: 12/09/2020 Elsevier Patient Education  2024 [employer].

## 2023-06-01 ENCOUNTER — Telehealth: Payer: Self-pay | Admitting: Neurology

## 2023-06-01 DIAGNOSIS — E1142 Type 2 diabetes mellitus with diabetic polyneuropathy: Secondary | ICD-10-CM

## 2023-06-01 DIAGNOSIS — E66811 Obesity, class 1: Secondary | ICD-10-CM

## 2023-06-01 NOTE — Telephone Encounter (Signed)
I called [facility] could not speak to pharmacy at this time. (Relyed to call again later).

## 2023-06-01 NOTE — Telephone Encounter (Signed)
[facility] and [facility] increased dose od semaglutide I would like him to stay on the 0,5 dose and not go up to the 1 dose. Please tell pharmacy to cancel the prescription I sent and I will call patien tthank you

## 2023-06-02 MED ORDER — OZEMPIC (1 MG/DOSE) 2 MG/1.5ML ~~LOC~~ SOPN: 2.0000 mg | PEN_INJECTOR | SUBCUTANEOUS | 0 refills | Status: DC

## 2023-06-02 MED ORDER — OZEMPIC (1 MG/DOSE) 2 MG/1.5ML ~~LOC~~ SOPN: 2.0000 mg | PEN_INJECTOR | SUBCUTANEOUS | Status: DC

## 2023-06-02 NOTE — Addendum Note (Signed)
Addended by: Guy Begin on: 06/02/2023 11:08 AM   Modules accepted: Orders

## 2023-06-02 NOTE — Telephone Encounter (Signed)
I cancelled the prescription by sending prescription with message to cancel.

## 2023-06-02 NOTE — Telephone Encounter (Signed)
Attempted to call pharmacy and am not able to reach.

## 2023-07-07 ENCOUNTER — Encounter: Payer: No Typology Code available for payment source | Admitting: Neurology

## 2023-08-13 ENCOUNTER — Encounter: Payer: Self-pay | Admitting: Neurology

## 2023-09-24 ENCOUNTER — Ambulatory Visit (INDEPENDENT_AMBULATORY_CARE_PROVIDER_SITE_OTHER): Admitting: Neurology

## 2023-09-24 ENCOUNTER — Encounter: Payer: Self-pay | Admitting: Neurology

## 2023-09-24 VITALS — BP 144/88 | HR 82 | Ht 73.0 in | Wt 241.0 lb

## 2023-09-24 DIAGNOSIS — R202 Paresthesia of skin: Secondary | ICD-10-CM

## 2023-09-24 DIAGNOSIS — R200 Anesthesia of skin: Secondary | ICD-10-CM | POA: Diagnosis not present

## 2023-09-24 MED ORDER — OZEMPIC (1 MG/DOSE) 2 MG/1.5ML ~~LOC~~ SOPN: 2.0000 mg | PEN_INJECTOR | SUBCUTANEOUS | 6 refills | Status: AC

## 2023-09-24 NOTE — Addendum Note (Signed)
 Addended by: Tryone Kille B on: 09/24/2023 11:06 AM   Modules accepted: Orders

## 2023-09-24 NOTE — Procedures (Signed)
 Full Name: Garrett Thompson Gender: Male MRN #: [id number] Date of Birth: 07-09-1975    Visit Date: 09/24/2023 09:47 Age: 48 Years    History: Chronic neck pain and left arm numbness. Status post prior C5-C6 ACDF. Multilevel degenerative changes on recent MRI cervical spine but no significant foraminal stenosis or radiologic nerve impingement. He has cervical disc disease and follows with [facility] neuro at the [facility] and pain clinic at the [facility], not a surgical candidate he declined spinal cord stimulator. Here for evaluation of possible peripheral nerve entrapment or other etiology. 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  Summary: Nerve conduction studies performed on the bilateral upper extremities. EMG performed on the left upper extremity. All nerves and muscles (as indicated in the following tables) were within normal limits.    Conclusion: This is a normal study.     ------------------------------- Aldona Amel, M.D.  [facility] [address] [place], [place] [address] Tel: [phone] Fax: [phone]  Verbal informed consent was obtained from the patient, patient was informed of potential risk of procedure, including bruising, bleeding, hematoma formation, infection, muscle weakness, muscle pain, numbness, among others.        MNC    Nerve / Sites Muscle Latency Ref. Amplitude Ref. Rel Amp Segments Distance Velocity Ref. Area    ms ms mV mV %  cm m/s m/s mVms  L Median - APB     Wrist APB 3.0 <=4.4 8.6 >=4.0 100 Wrist - APB 7   22.8     Upper arm APB 8.4  6.8  79.3 Upper arm - Wrist 32 60 >=49 16.0  R Median - APB     Wrist APB 3.0 <=4.4 6.8 >=4.0 100 Wrist - APB 7   17.3     Upper arm APB 8.7  4.6  67.9 Upper arm - Wrist 31 54 >=49 15.0  L Ulnar - ADM     Wrist ADM 2.5 <=3.3 13.3 >=6.0 100 Wrist - ADM 7   44.1     B.Elbow ADM 7.5  8.8  66.2 B.Elbow - Wrist 31 62 >=49 29.8     A.Elbow ADM 9.2   12.0  136 A.Elbow - B.Elbow 10 60 >=49 42.9           SNC    Nerve / Sites Rec. Site Peak Lat Ref.  Amp Ref. Segments Distance Peak Diff Ref.    ms ms V V  cm ms ms  L Radial - Anatomical snuff box (Forearm)     Forearm Wrist 2.6 <=2.9 38 >=15 Forearm - Wrist 10    L Median, Ulnar - Transcarpal comparison     Median Palm Wrist 1.8 <=2.2 36 >=35 Median Palm - Wrist 8       Ulnar Palm Wrist 1.8 <=2.2 15 >=12 Ulnar Palm - Wrist 8          Median Palm - Ulnar Palm  -0.0 <=0.4  R Median, Ulnar - Transcarpal comparison     Median Palm Wrist 2.0 <=2.2 35 >=35 Median Palm - Wrist 8       Ulnar Palm Wrist 1.9 <=2.2 15 >=12 Ulnar Palm - Wrist 8          Median Palm - Ulnar Palm  0.1 <=0.4  L Median - Orthodromic (Dig II, Mid palm)     Dig II Wrist 2.9 <=3.4 12 >=10 Dig II -  Wrist 13    R Median - Orthodromic (Dig II, Mid palm)     Dig II Wrist 3.4 <=3.4 19 >=10 Dig II - Wrist 13    L Ulnar - Orthodromic, (Dig V, Mid palm)     Dig V Wrist 2.9 <=3.1 5 >=5 Dig V - Wrist 30                   F  Wave    Nerve F Lat Ref.   ms ms  L Ulnar - ADM 25.2 <=32.0       EMG Summary Table    Spontaneous MUAP Recruitment  Muscle IA Fib PSW Fasc Other Amp Dur. Poly Pattern  L. Deltoid Normal None None None _______ Normal Normal Normal Normal  L. Triceps brachii Normal None None None _______ Normal Normal Normal Normal  L. Biceps brachii Normal None None None _______ Normal Normal Normal Normal  L. Pronator teres Normal None None None _______ Normal Normal Normal Normal  L. First dorsal interosseous Normal None None None _______ Normal Normal Normal Normal  L. Opponens pollicis Normal None None None _______ Normal Normal Normal Normal  L. Cervical paraspinals (low) Normal None None None _______ Normal Normal Normal Normal  L. Deltoid Normal None None None _______ Normal Normal Normal Normal

## 2023-09-24 NOTE — Progress Notes (Signed)
 Full Name: Garrett Thompson Gender: Male MRN #: [id number] Date of Birth: 07-09-1975    Visit Date: 09/24/2023 09:47 Age: 48 Years    History: Chronic neck pain and left arm numbness. Status post prior C5-C6 ACDF. Multilevel degenerative changes on recent MRI cervical spine but no significant foraminal stenosis or radiologic nerve impingement. He has cervical disc disease and follows with [facility], neuro at the [facility] and pain clinic at the [facility], not a surgical candidate he declined spinal cord stimulator. Here for evaluation of possible peripheral nerve entrapment or other etiology. 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  Summary: Nerve conduction studies performed on the bilateral upper extremities. EMG performed on the left upper extremity. All nerves and muscles (as indicated in the following tables) were within normal limits.    Conclusion: This is a normal study.     ------------------------------- Aldona Amel, M.D.  [facility] [address] [place], [place] [address] Tel: [phone] Fax: [phone]  Verbal informed consent was obtained from the patient, patient was informed of potential risk of procedure, including bruising, bleeding, hematoma formation, infection, muscle weakness, muscle pain, numbness, among others.        MNC    Nerve / Sites Muscle Latency Ref. Amplitude Ref. Rel Amp Segments Distance Velocity Ref. Area    ms ms mV mV %  cm m/s m/s mVms  L Median - APB     Wrist APB 3.0 <=4.4 8.6 >=4.0 100 Wrist - APB 7   22.8     Upper arm APB 8.4  6.8  79.3 Upper arm - Wrist 32 60 >=49 16.0  R Median - APB     Wrist APB 3.0 <=4.4 6.8 >=4.0 100 Wrist - APB 7   17.3     Upper arm APB 8.7  4.6  67.9 Upper arm - Wrist 31 54 >=49 15.0  L Ulnar - ADM     Wrist ADM 2.5 <=3.3 13.3 >=6.0 100 Wrist - ADM 7   44.1     B.Elbow ADM 7.5  8.8  66.2 B.Elbow - Wrist 31 62 >=49 29.8     A.Elbow ADM 9.2   12.0  136 A.Elbow - B.Elbow 10 60 >=49 42.9           SNC    Nerve / Sites Rec. Site Peak Lat Ref.  Amp Ref. Segments Distance Peak Diff Ref.    ms ms V V  cm ms ms  L Radial - Anatomical snuff box (Forearm)     Forearm Wrist 2.6 <=2.9 38 >=15 Forearm - Wrist 10    L Median, Ulnar - Transcarpal comparison     Median Palm Wrist 1.8 <=2.2 36 >=35 Median Palm - Wrist 8       Ulnar Palm Wrist 1.8 <=2.2 15 >=12 Ulnar Palm - Wrist 8          Median Palm - Ulnar Palm  -0.0 <=0.4  R Median, Ulnar - Transcarpal comparison     Median Palm Wrist 2.0 <=2.2 35 >=35 Median Palm - Wrist 8       Ulnar Palm Wrist 1.9 <=2.2 15 >=12 Ulnar Palm - Wrist 8          Median Palm - Ulnar Palm  0.1 <=0.4  L Median - Orthodromic (Dig II, Mid palm)     Dig II Wrist 2.9 <=3.4 12 >=10 Dig II -  Wrist 13    R Median - Orthodromic (Dig II, Mid palm)     Dig II Wrist 3.4 <=3.4 19 >=10 Dig II - Wrist 13    L Ulnar - Orthodromic, (Dig V, Mid palm)     Dig V Wrist 2.9 <=3.1 5 >=5 Dig V - Wrist 30                   F  Wave    Nerve F Lat Ref.   ms ms  L Ulnar - ADM 25.2 <=32.0       EMG Summary Table    Spontaneous MUAP Recruitment  Muscle IA Fib PSW Fasc Other Amp Dur. Poly Pattern  L. Deltoid Normal None None None _______ Normal Normal Normal Normal  L. Triceps brachii Normal None None None _______ Normal Normal Normal Normal  L. Biceps brachii Normal None None None _______ Normal Normal Normal Normal  L. Pronator teres Normal None None None _______ Normal Normal Normal Normal  L. First dorsal interosseous Normal None None None _______ Normal Normal Normal Normal  L. Opponens pollicis Normal None None None _______ Normal Normal Normal Normal  L. Cervical paraspinals (low) Normal None None None _______ Normal Normal Normal Normal  L. Deltoid Normal None None None _______ Normal Normal Normal Normal

## 2023-09-25 ENCOUNTER — Ambulatory Visit: Payer: Self-pay | Admitting: Orthopedic Surgery

## 2023-09-25 NOTE — Progress Notes (Signed)
 Ok thx.

## 2023-11-02 ENCOUNTER — Emergency Department (HOSPITAL_COMMUNITY)

## 2023-11-02 ENCOUNTER — Emergency Department (HOSPITAL_COMMUNITY)
Admission: EM | Admit: 2023-11-02 | Discharge: 2023-11-02 | Disposition: A | Discharge status: Home / Self Care | Attending: Emergency Medicine | Admitting: Emergency Medicine

## 2023-11-02 ENCOUNTER — Other Ambulatory Visit: Payer: Self-pay

## 2023-11-02 ENCOUNTER — Encounter (HOSPITAL_COMMUNITY): Payer: Self-pay

## 2023-11-02 DIAGNOSIS — R519 Headache, unspecified: Secondary | ICD-10-CM

## 2023-11-02 DIAGNOSIS — B349 Viral infection, unspecified: Secondary | ICD-10-CM | POA: Diagnosis not present

## 2023-11-02 DIAGNOSIS — R197 Diarrhea, unspecified: Secondary | ICD-10-CM

## 2023-11-02 DIAGNOSIS — R059 Cough, unspecified: Secondary | ICD-10-CM | POA: Insufficient documentation

## 2023-11-02 DIAGNOSIS — Z7982 Long term (current) use of aspirin: Secondary | ICD-10-CM | POA: Insufficient documentation

## 2023-11-02 DIAGNOSIS — N189 Chronic kidney disease, unspecified: Secondary | ICD-10-CM | POA: Diagnosis not present

## 2023-11-02 LAB — CBC WITH DIFFERENTIAL/PLATELET
Abs Immature Granulocytes: 0.03 10*3/uL (ref 0.00–0.07)
Basophils Absolute: 0 10*3/uL (ref 0.0–0.1)
Basophils Relative: 0 %
Eosinophils Absolute: 0.1 10*3/uL (ref 0.0–0.5)
Eosinophils Relative: 2 %
HCT: 50.1 % (ref 39.0–52.0)
Hemoglobin: 17.3 g/dL — ABNORMAL HIGH (ref 13.0–17.0)
Immature Granulocytes: 0 %
Lymphocytes Relative: 11 %
Lymphs Abs: 0.9 10*3/uL (ref 0.7–4.0)
MCH: 28.3 pg (ref 26.0–34.0)
MCHC: 34.5 g/dL (ref 30.0–36.0)
MCV: 81.9 fL (ref 80.0–100.0)
Monocytes Absolute: 0.4 10*3/uL (ref 0.1–1.0)
Monocytes Relative: 5 %
Neutro Abs: 6.6 10*3/uL (ref 1.7–7.7)
Neutrophils Relative %: 82 %
Platelets: 269 10*3/uL (ref 150–400)
RBC: 6.12 MIL/uL — ABNORMAL HIGH (ref 4.22–5.81)
RDW: 14.6 % (ref 11.5–15.5)
WBC: 8.1 10*3/uL (ref 4.0–10.5)
nRBC: 0 % (ref 0.0–0.2)

## 2023-11-02 LAB — RESP PANEL BY RT-PCR (RSV, FLU A&B, COVID)  RVPGX2
Influenza A by PCR: NEGATIVE
Influenza B by PCR: NEGATIVE
Resp Syncytial Virus by PCR: NEGATIVE
SARS Coronavirus 2 by RT PCR: NEGATIVE

## 2023-11-02 LAB — COMPREHENSIVE METABOLIC PANEL WITH GFR
ALT: 34 U/L (ref 0–44)
AST: 34 U/L (ref 15–41)
Albumin: 5.1 g/dL — ABNORMAL HIGH (ref 3.5–5.0)
Alkaline Phosphatase: 126 U/L (ref 38–126)
Anion gap: 14 (ref 5–15)
BUN: 12 mg/dL (ref 6–20)
CO2: 24 mmol/L (ref 22–32)
Calcium: 9.3 mg/dL (ref 8.9–10.3)
Chloride: 104 mmol/L (ref 98–111)
Creatinine, Ser: 1.56 mg/dL — ABNORMAL HIGH (ref 0.61–1.24)
GFR, Estimated: 54 mL/min — ABNORMAL LOW (ref >60)
Glucose, Bld: 113 mg/dL — ABNORMAL HIGH (ref 70–99)
Potassium: 3.5 mmol/L (ref 3.5–5.1)
Sodium: 142 mmol/L (ref 135–145)
Total Bilirubin: 0.8 mg/dL (ref 0.0–1.2)
Total Protein: 9.2 g/dL — ABNORMAL HIGH (ref 6.5–8.1)

## 2023-11-02 LAB — TROPONIN I (HIGH SENSITIVITY): Troponin I (High Sensitivity): <2 ng/L (ref <18)

## 2023-11-02 LAB — LIPASE, BLOOD: Lipase: 39 U/L (ref 11–51)

## 2023-11-02 MED ORDER — DICYCLOMINE HCL 20 MG PO TABS
20.0000 mg | ORAL_TABLET | Freq: Two times a day (BID) | ORAL | 0 refills | Status: AC
Start: 2023-11-02 — End: ?

## 2023-11-02 MED ORDER — LACTATED RINGERS IV BOLUS
1000.0000 mL | Freq: Once | INTRAVENOUS | Status: AC
  Administered 2023-11-02: 1000 mL via INTRAVENOUS

## 2023-11-02 MED ORDER — KETOROLAC TROMETHAMINE 15 MG/ML IJ SOLN
15.0000 mg | Freq: Once | INTRAMUSCULAR | Status: AC
  Administered 2023-11-02: 15 mg via INTRAVENOUS
  Filled 2023-11-02: qty 1

## 2023-11-02 MED ORDER — DIPHENHYDRAMINE HCL 50 MG/ML IJ SOLN
25.0000 mg | Freq: Once | INTRAMUSCULAR | Status: AC
  Administered 2023-11-02: 25 mg via INTRAVENOUS
  Filled 2023-11-02: qty 1

## 2023-11-02 MED ORDER — FLUTICASONE PROPIONATE 50 MCG/ACT NA SUSP: 2.0000 | Freq: Every day | NASAL | 0 refills | Status: AC

## 2023-11-02 MED ORDER — METOCLOPRAMIDE HCL 10 MG PO TABS
10.0000 mg | ORAL_TABLET | Freq: Four times a day (QID) | ORAL | 0 refills | Status: AC
Start: 2023-11-02 — End: ?

## 2023-11-02 MED ORDER — PROCHLORPERAZINE EDISYLATE 10 MG/2ML IJ SOLN
10.0000 mg | Freq: Once | INTRAMUSCULAR | Status: AC
  Administered 2023-11-02: 10 mg via INTRAVENOUS
  Filled 2023-11-02: qty 2

## 2023-11-02 NOTE — Discharge Instructions (Signed)
 Please follow-up closely with your primary care doctor on an outpatient basis.  Return to emergency department immediately for any new or worsening symptoms.

## 2023-11-02 NOTE — ED Provider Notes (Signed)
 [facility] AT [facility] Provider Note   CSN: [id number] Arrival date & time: 11/02/23  1203     Patient presents with: Headache   Garrett L Kashden Deboy. is a 48 y.o. male.   Patient is a 48 year old male who presents emergency department the chief complaint of headache and diarrhea.  He notes that the symptoms began this morning.  He notes that he has been experiencing cough, congestion and sinus congestion over the past few days.  Patient notes that he has had no recent falls or blunt trauma.  He denies any thunderclap nature of the headache.  He denies any dizziness, lightheadedness or syncope.  He does admit to some intermittent discomfort in his chest but denies any shortness of breath.  He denies any associated nausea or vomiting.   Headache      Prior to Admission medications   Medication Sig Start Date End Date Taking? Authorizing Provider  acetaminophen  (TYLENOL ) 500 MG tablet Take 1,000 mg by mouth every 8 (eight) hours as needed for mild pain or headache. 05/01/20   [provider]  albuterol  (PROVENTIL  HFA;VENTOLIN  HFA) 108 (90 BASE) MCG/ACT inhaler Inhale 2 puffs into the lungs every 4 (four) hours as needed for wheezing or shortness of breath (or coughing). 05/11/13   Raford Lenis, MD  amLODipine  (NORVASC ) 5 MG tablet Take 1 tablet (5 mg total) by mouth daily. 08/10/21   Bryn Bernardino NOVAK, MD  aspirin  EC 81 MG tablet Take by mouth. 02/25/23   [provider]  aspirin -acetaminophen -caffeine (EXCEDRIN MIGRAINE) 250-250-65 MG tablet Take by mouth. 03/12/23   [provider]  B Complex Vitamins (VITAMIN B COMPLEX) CAPS Take 2 capsules by mouth daily. 02/25/23   [provider]  cetirizine (ZYRTEC) 10 MG tablet Take 10 mg by mouth daily as needed for allergies. 11/25/19   [provider]  citalopram (CELEXA) 20 MG tablet Take 10 mg by mouth 2 (two) times daily.    [provider]  Continuous Blood  Gluc Sensor (FREESTYLE LIBRE 3 SENSOR) MISC Place 1 sensor on the skin every 14 days. Use to check glucose continuously 08/09/21   Bryn Bernardino NOVAK, MD  docusate sodium (COLACE) 100 MG capsule Take 200 mg by mouth daily.    [provider]  empagliflozin (JARDIANCE) 25 MG TABS tablet Take 12.5 mg by mouth daily.    [provider]  fluticasone (FLONASE) 50 MCG/ACT nasal spray Place 1 spray into both nostrils daily as needed for allergies. 06/19/20   [provider]  gabapentin  (NEURONTIN ) 300 MG capsule Take 300 mg by mouth at bedtime. 05/13/23   [provider]  lisinopril (ZESTRIL) 10 MG tablet Take 10 mg by mouth 2 (two) times daily.    [provider]  Melatonin 10 MG CAPS Take 10 mg by mouth at bedtime as needed (sleep). 03/26/21   [provider]  Multiple Vitamin (MULTIVITAMINS PO) Take 1 tablet by mouth daily. 03/12/23   [provider]  Multiple Vitamins-Minerals (ONE A DAY MENS VITACRAVES PO) Take 1 tablet by mouth daily.    [provider]  omeprazole  (PRILOSEC) 20 MG capsule Take 20 mg by mouth daily.    [provider]  OMEPRAZOLE  PO Take 20 mg by mouth daily. 02/25/23   [provider]  pregabalin (LYRICA) 75 MG capsule Take 75 mg by mouth 2 (two) times daily. And take 2 capsules by mouth at bedtime for neuralgia 05/13/23   [provider]  PSYLLIUM PO Take by mouth. 03/12/23   [provider]  QUEtiapine (SEROQUEL) 100 MG tablet Take 100 mg by mouth at bedtime. Patient not taking: Reported on 09/24/2023 01/08/23   [provider]  Semaglutide , 1 MG/DOSE, (OZEMPIC , 1 MG/DOSE,) 2 MG/1.5ML SOPN Inject 2 mg into the skin once a week. 09/24/23   Ines Onetha NOVAK, MD  sildenafil (VIAGRA) 100 MG tablet Take by mouth. 02/25/23   [provider]  simethicone (MYLICON) 80 MG chewable tablet Chew by mouth. 05/11/23   [provider]  traZODone  (DESYREL ) 100 MG tablet Take 1  tablet by mouth at bedtime as needed. Patient not taking: Reported on 05/29/2023 03/26/21   [provider]    Allergies: Amitriptyline, Cyclobenzaprine, Methocarbamol, Mobic [meloxicam], Mushroom extract complex (obsolete), Other, Rosuvastatin, Shellfish allergy, Sumatriptan, and Tizanidine    Review of Systems  Neurological:  Positive for headaches.  All other systems reviewed and are negative.   Updated Vital Signs BP (!) 125/99 (BP Location: Right Arm)   Pulse 97   Temp (!) 97.4 F (36.3 C)   Resp 18   Ht 6' 1 (1.854 m)   Wt 109.3 kg   SpO2 91%   BMI 31.79 kg/m   Physical Exam Vitals and nursing note reviewed.  Constitutional:      General: He is not in acute distress.    Appearance: Normal appearance. He is not ill-appearing.  HENT:     Head: Normocephalic and atraumatic.     Nose: Nose normal.     Mouth/Throat:     Mouth: Mucous membranes are moist.   Eyes:     Extraocular Movements: Extraocular movements intact.     Conjunctiva/sclera: Conjunctivae normal.     Pupils: Pupils are equal, round, and reactive to light.   Neck:     Meningeal: Brudzinski's sign and Kernig's sign absent.   Cardiovascular:     Rate and Rhythm: Normal rate and regular rhythm.     Pulses: Normal pulses.     Heart sounds: Normal heart sounds. No murmur heard.    No gallop.  Pulmonary:     Effort: Pulmonary effort is normal. No respiratory distress.     Breath sounds: Normal breath sounds. No stridor. No wheezing, rhonchi or rales.  Abdominal:     General: Abdomen is flat. Bowel sounds are normal. There is no distension.     Palpations: Abdomen is soft. There is no mass.     Tenderness: There is no abdominal tenderness. There is no guarding.   Musculoskeletal:        General: No swelling or tenderness. Normal range of motion.     Cervical back: Normal range of motion and neck supple. No rigidity.  Lymphadenopathy:     Cervical: No cervical adenopathy.   Skin:     General: Skin is warm and dry.   Neurological:     General: No focal deficit present.     Mental Status: He is alert and oriented to person, place, and time. Mental status is at baseline.     Cranial Nerves: No cranial nerve deficit, dysarthria or facial asymmetry.     Sensory: No sensory deficit.     Motor: No weakness.     Coordination: Coordination normal.     Gait: Gait normal.     Deep Tendon Reflexes: Reflexes normal.   Psychiatric:        Mood and Affect: Mood normal. Mood is not anxious or depressed.  Behavior: Behavior normal. Behavior is not agitated.        Thought Content: Thought content normal.        Cognition and Memory: Cognition is not impaired. Memory is not impaired.        Judgment: Judgment normal.     (all labs ordered are listed, but only abnormal results are displayed) Labs Reviewed  COMPREHENSIVE METABOLIC PANEL WITH GFR - Abnormal; Notable for the following components:      Result Value   Glucose, Bld 113 (*)    Creatinine, Ser 1.56 (*)    Total Protein 9.2 (*)    Albumin 5.1 (*)    GFR, Estimated 54 (*)    All other components within normal limits  CBC WITH DIFFERENTIAL/PLATELET - Abnormal; Notable for the following components:   RBC 6.12 (*)    Hemoglobin 17.3 (*)    All other components within normal limits  RESP PANEL BY RT-PCR (RSV, FLU A&B, COVID)  RVPGX2  LIPASE, BLOOD  TROPONIN I (HIGH SENSITIVITY)    EKG: None  Radiology: CT Head Wo Contrast Result Date: 11/02/2023 CLINICAL DATA:  Severe headache. EXAM: CT HEAD WITHOUT CONTRAST TECHNIQUE: Contiguous axial images were obtained from the base of the skull through the vertex without intravenous contrast. RADIATION DOSE REDUCTION: This exam was performed according to the departmental dose-optimization program which includes automated exposure control, adjustment of the mA and/or kV according to patient size and/or use of iterative reconstruction technique. COMPARISON:  December 24, 2011  FINDINGS: Brain: No evidence of acute infarction, hemorrhage, hydrocephalus, extra-axial collection or mass lesion/mass effect. Vascular: No hyperdense vessel or unexpected calcification. Skull: Normal. Negative for fracture or focal lesion. Sinuses/Orbits: No acute finding. Other: None. IMPRESSION: No acute intracranial pathology. Electronically Signed   By: Suzen Dials M.D.   On: 11/02/2023 14:18   DG Chest Port 1 View Result Date: 11/02/2023 CLINICAL DATA:  Cough and fever. EXAM: PORTABLE CHEST 1 VIEW COMPARISON:  05/11/2013 FINDINGS: Lung volumes are low. Heart is upper normal in size, likely accentuated by low lung volumes and portable technique. Subsegmental atelectasis at the left lung base. No confluent consolidation. Normal pulmonary vasculature. No pleural effusion or pneumothorax. On limited assessment, no acute osseous findings IMPRESSION: Low lung volumes with subsegmental atelectasis at the left lung base. Electronically Signed   By: Andrea Gasman M.D.   On: 11/02/2023 12:58     Procedures   Medications Ordered in the ED  lactated ringers  bolus 1,000 mL (1,000 mLs Intravenous Bolus from Bag 11/02/23 1359)  prochlorperazine (COMPAZINE) injection 10 mg (10 mg Intravenous Given 11/02/23 1358)  diphenhydrAMINE  (BENADRYL ) injection 25 mg (25 mg Intravenous Given 11/02/23 1359)  ketorolac (TORADOL) 15 MG/ML injection 15 mg (15 mg Intravenous Given 11/02/23 1357)                                    Medical Decision Making Amount and/or Complexity of Data Reviewed Labs: ordered. Radiology: ordered.  Risk Prescription drug management.   This patient presents to the ED for concern of headache, diarrhea, cough, congestion differential diagnosis includes acute viral syndrome, influenza, COVID-19, sinusitis, intracranial hemorrhage, meningitis, encephalitis, pneumonia    Additional history obtained:  Additional history obtained from family External records from outside source  obtained and reviewed including medical records   Lab Tests:  I Ordered, and personally interpreted labs.  The pertinent results include: No leukocytosis, no anemia, creatinine better  than baseline, normal liver function electrolytes, negative troponin, negative viral swab   Imaging Studies ordered:  I ordered imaging studies including chest x-ray, CT scan of head I independently visualized and interpreted imaging which showed no acute cardiopulmonary process, no acute intracranial process I agree with the radiologist interpretation   Medicines ordered and prescription drug management:  I ordered medication including Compazine, Benadryl , IV fluids, Toradol for headache and dehydration Reevaluation of the patient after these medicines showed that the patient improved I have reviewed the patients home medicines and have made adjustments as needed   Problem List / ED Course:  Patient is doing well at this time and is stable for discharge home.  Patient's symptoms have greatly improved with treatment in the emergency department.  Suspect that he is suffering from an acute viral syndrome at this point.  Continue symptomatic treatment outpatient basis was discussed and medications were provided.  Patient has no clinical indication for meningitis or encephalitis at this point.  CT scan of the head was unremarkable.  Do not suspect subarachnoid hemorrhage, space-occupying lesion.  He has had no recent bug bites or tick bites and do not suspect tickborne illness.  Do not suspect any further workup is warranted at this time.  Close follow-up with primary care doctor was discussed as well as strict turn precautions for any new or worsening symptoms.  Patient voiced understand to the plan and had no additional questions.   Social Determinants of Health:  None        Final diagnoses:  None    ED Discharge Orders     None          Daralene Lonni JONETTA DEVONNA 11/02/23 1516     Yolande Lamar BROCKS, MD 11/03/23 [phone]

## 2023-11-02 NOTE — ED Triage Notes (Signed)
 Pt arrived via POV c/o severe headache and constant diarrhea that began this morning at apprx 0200. Pt reports the diarrhea resemble clear water. Pt reports recent sore throat and sinus congestion and has been trying OTC medications w/o relief.

## 2024-03-14 ENCOUNTER — Encounter: Payer: Self-pay | Admitting: Radiology
